# Patient Record
Sex: Male | Born: 1935 | Race: White | Hispanic: No | Marital: Married | Smoking: Current every day smoker
Health system: Southern US, Community
[De-identification: ages and names within clinical notes are randomized; demographics above are authoritative.]

## PROBLEM LIST (undated history)

## (undated) DIAGNOSIS — C679 Malignant neoplasm of bladder, unspecified: Secondary | ICD-10-CM

## (undated) DIAGNOSIS — I639 Cerebral infarction, unspecified: Secondary | ICD-10-CM

## (undated) DIAGNOSIS — Z9884 Bariatric surgery status: Secondary | ICD-10-CM

## (undated) DIAGNOSIS — E785 Hyperlipidemia, unspecified: Secondary | ICD-10-CM

## (undated) DIAGNOSIS — F329 Major depressive disorder, single episode, unspecified: Secondary | ICD-10-CM

## (undated) DIAGNOSIS — C221 Intrahepatic bile duct carcinoma: Secondary | ICD-10-CM

## (undated) DIAGNOSIS — G629 Polyneuropathy, unspecified: Secondary | ICD-10-CM

## (undated) DIAGNOSIS — G4700 Insomnia, unspecified: Secondary | ICD-10-CM

## (undated) DIAGNOSIS — M1990 Unspecified osteoarthritis, unspecified site: Secondary | ICD-10-CM

## (undated) DIAGNOSIS — F32A Depression, unspecified: Secondary | ICD-10-CM

## (undated) DIAGNOSIS — R55 Syncope and collapse: Secondary | ICD-10-CM

## (undated) DIAGNOSIS — K219 Gastro-esophageal reflux disease without esophagitis: Secondary | ICD-10-CM

## (undated) HISTORY — DX: Malignant neoplasm of bladder, unspecified: C67.9

## (undated) HISTORY — DX: Gastro-esophageal reflux disease without esophagitis: K21.9

## (undated) HISTORY — DX: Cerebral infarction, unspecified: I63.9

## (undated) HISTORY — DX: Syncope and collapse: R55

## (undated) HISTORY — DX: Bariatric surgery status: Z98.84

## (undated) HISTORY — DX: Hyperlipidemia, unspecified: E78.5

## (undated) HISTORY — DX: Unspecified osteoarthritis, unspecified site: M19.90

## (undated) HISTORY — DX: Insomnia, unspecified: G47.00

## (undated) HISTORY — PX: BLADDER SURGERY: SHX569

## (undated) HISTORY — DX: Polyneuropathy, unspecified: G62.9

## (undated) HISTORY — PX: ABDOMINAL SURGERY: SHX537

## (undated) HISTORY — DX: Depression, unspecified: F32.A

## (undated) HISTORY — PX: PENILE PROSTHESIS IMPLANT: SHX240

## (undated) HISTORY — DX: Major depressive disorder, single episode, unspecified: F32.9

## (undated) HISTORY — PX: OTHER SURGICAL HISTORY: SHX169

---

## 1996-07-11 DIAGNOSIS — I639 Cerebral infarction, unspecified: Secondary | ICD-10-CM

## 1996-07-11 HISTORY — DX: Cerebral infarction, unspecified: I63.9

## 2002-07-11 HISTORY — PX: OTHER SURGICAL HISTORY: SHX169

## 2003-07-12 HISTORY — PX: OTHER SURGICAL HISTORY: SHX169

## 2010-10-23 ENCOUNTER — Emergency Department (HOSPITAL_COMMUNITY)
Admission: EM | Admit: 2010-10-23 | Discharge: 2010-10-23 | Disposition: A | Discharge status: Home / Self Care | Payer: Medicare Other | Attending: Emergency Medicine | Admitting: Emergency Medicine

## 2010-10-23 ENCOUNTER — Emergency Department (HOSPITAL_COMMUNITY): Payer: Medicare Other

## 2010-10-23 DIAGNOSIS — S01409A Unspecified open wound of unspecified cheek and temporomandibular area, initial encounter: Secondary | ICD-10-CM | POA: Insufficient documentation

## 2010-10-23 DIAGNOSIS — W1809XA Striking against other object with subsequent fall, initial encounter: Secondary | ICD-10-CM | POA: Insufficient documentation

## 2010-10-23 DIAGNOSIS — R55 Syncope and collapse: Secondary | ICD-10-CM | POA: Insufficient documentation

## 2010-10-23 DIAGNOSIS — Z8673 Personal history of transient ischemic attack (TIA), and cerebral infarction without residual deficits: Secondary | ICD-10-CM | POA: Insufficient documentation

## 2010-10-23 DIAGNOSIS — R799 Abnormal finding of blood chemistry, unspecified: Secondary | ICD-10-CM | POA: Insufficient documentation

## 2010-10-23 DIAGNOSIS — E119 Type 2 diabetes mellitus without complications: Secondary | ICD-10-CM | POA: Insufficient documentation

## 2010-10-23 DIAGNOSIS — S0003XA Contusion of scalp, initial encounter: Secondary | ICD-10-CM | POA: Insufficient documentation

## 2010-10-23 DIAGNOSIS — Y93A9 Activity, other involving cardiorespiratory exercise: Secondary | ICD-10-CM | POA: Insufficient documentation

## 2010-10-23 DIAGNOSIS — Z9884 Bariatric surgery status: Secondary | ICD-10-CM | POA: Insufficient documentation

## 2010-10-23 DIAGNOSIS — S0990XA Unspecified injury of head, initial encounter: Secondary | ICD-10-CM | POA: Insufficient documentation

## 2010-10-23 DIAGNOSIS — Y929 Unspecified place or not applicable: Secondary | ICD-10-CM | POA: Insufficient documentation

## 2010-10-23 DIAGNOSIS — IMO0002 Reserved for concepts with insufficient information to code with codable children: Secondary | ICD-10-CM | POA: Insufficient documentation

## 2010-10-23 DIAGNOSIS — S1093XA Contusion of unspecified part of neck, initial encounter: Secondary | ICD-10-CM | POA: Insufficient documentation

## 2010-10-23 DIAGNOSIS — Z79899 Other long term (current) drug therapy: Secondary | ICD-10-CM | POA: Insufficient documentation

## 2010-10-23 LAB — PROTIME-INR
INR: 0.94 (ref 0.00–1.49)
Prothrombin Time: 12.8 s (ref 11.6–15.2)

## 2010-10-23 LAB — COMPREHENSIVE METABOLIC PANEL
ALT: 28 U/L (ref 0–53)
Albumin: 4.1 g/dL (ref 3.5–5.2)
Alkaline Phosphatase: 58 U/L (ref 39–117)
Calcium: 9.5 mg/dL (ref 8.4–10.5)
Potassium: 4 mEq/L (ref 3.5–5.1)
Sodium: 138 mEq/L (ref 135–145)
Total Protein: 7.2 g/dL (ref 6.0–8.3)

## 2010-10-23 LAB — COMPREHENSIVE METABOLIC PANEL WITH GFR
AST: 35 U/L (ref 0–37)
BUN: 14 mg/dL (ref 6–23)
CO2: 27 meq/L (ref 19–32)
Chloride: 106 meq/L (ref 96–112)
Creatinine, Ser: 1.32 mg/dL (ref 0.4–1.5)
GFR calc Af Amer: >60 mL/min (ref >60)
GFR calc non Af Amer: 53 mL/min — ABNORMAL LOW (ref >60)
Glucose, Bld: 99 mg/dL (ref 70–99)
Total Bilirubin: 0.9 mg/dL (ref 0.3–1.2)

## 2010-10-23 LAB — CBC
HCT: 40.4 % (ref 39.0–52.0)
Hemoglobin: 14 g/dL (ref 13.0–17.0)
MCH: 34.9 pg — ABNORMAL HIGH (ref 26.0–34.0)
MCHC: 34.7 g/dL (ref 30.0–36.0)
MCV: 100.7 fL — ABNORMAL HIGH (ref 78.0–100.0)
Platelets: 133 K/uL — ABNORMAL LOW (ref 150–400)
RBC: 4.01 MIL/uL — ABNORMAL LOW (ref 4.22–5.81)
RDW: 13 % (ref 11.5–15.5)
WBC: 5.3 K/uL (ref 4.0–10.5)

## 2010-10-23 LAB — URINALYSIS, ROUTINE W REFLEX MICROSCOPIC
Bilirubin Urine: NEGATIVE
Glucose, UA: NEGATIVE mg/dL
Ketones, ur: NEGATIVE mg/dL
Leukocytes, UA: NEGATIVE
Nitrite: NEGATIVE
Protein, ur: NEGATIVE mg/dL
Specific Gravity, Urine: 1.03 (ref 1.005–1.030)
Urobilinogen, UA: 0.2 mg/dL (ref 0.0–1.0)
pH: 5 (ref 5.0–8.0)

## 2010-10-23 LAB — TROPONIN I: Troponin I: 0.01 ng/mL (ref 0.00–0.06)

## 2010-10-23 LAB — CK TOTAL AND CKMB (NOT AT ARMC)
CK, MB: 3.9 ng/mL (ref 0.3–4.0)
Relative Index: 3 — ABNORMAL HIGH (ref 0.0–2.5)
Total CK: 129 U/L (ref 7–232)

## 2010-10-23 LAB — DIFFERENTIAL
Basophils Absolute: 0 K/uL (ref 0.0–0.1)
Basophils Relative: 0 % (ref 0–1)
Eosinophils Absolute: 0 K/uL (ref 0.0–0.7)
Eosinophils Relative: 1 % (ref 0–5)
Lymphocytes Relative: 17 % (ref 12–46)
Lymphs Abs: 0.9 10*3/uL (ref 0.7–4.0)
Monocytes Absolute: 0.5 10*3/uL (ref 0.1–1.0)
Monocytes Relative: 10 % (ref 3–12)
Neutro Abs: 3.8 10*3/uL (ref 1.7–7.7)
Neutrophils Relative %: 72 % (ref 43–77)

## 2010-10-23 LAB — APTT: aPTT: 24 s (ref 24–37)

## 2010-10-23 LAB — URINE MICROSCOPIC-ADD ON

## 2010-10-23 LAB — D-DIMER, QUANTITATIVE: D-Dimer, Quant: 2.04 ug/mL-FEU — ABNORMAL HIGH (ref 0.00–0.48)

## 2010-10-23 MED ORDER — IOHEXOL 300 MG/ML  SOLN
60.0000 mL | Freq: Once | INTRAMUSCULAR | Status: AC | PRN
  Administered 2010-10-23: 60 mL via INTRAVENOUS

## 2010-12-29 ENCOUNTER — Encounter: Payer: Self-pay | Admitting: *Deleted

## 2010-12-29 ENCOUNTER — Encounter: Payer: Self-pay | Admitting: Internal Medicine

## 2010-12-30 ENCOUNTER — Encounter (INDEPENDENT_AMBULATORY_CARE_PROVIDER_SITE_OTHER): Payer: Self-pay | Admitting: Surgery

## 2010-12-30 ENCOUNTER — Encounter: Payer: Self-pay | Admitting: *Deleted

## 2010-12-30 ENCOUNTER — Encounter: Payer: Self-pay | Admitting: Internal Medicine

## 2010-12-30 ENCOUNTER — Ambulatory Visit (INDEPENDENT_AMBULATORY_CARE_PROVIDER_SITE_OTHER): Payer: Medicare Other | Admitting: Internal Medicine

## 2010-12-30 DIAGNOSIS — G629 Polyneuropathy, unspecified: Secondary | ICD-10-CM | POA: Insufficient documentation

## 2010-12-30 DIAGNOSIS — R55 Syncope and collapse: Secondary | ICD-10-CM

## 2010-12-30 DIAGNOSIS — G589 Mononeuropathy, unspecified: Secondary | ICD-10-CM

## 2010-12-30 NOTE — Patient Instructions (Addendum)
Your physician has recommended that you have a tilt table test. This test is sometimes used to help determine the cause of fainting spells. You lie on a table that moves from a lying down to an upright position. The change in position can bring on loss of consciousness. The doctor monitors your symptoms, heart rate, EKG, and blood pressure throughout the test. The doctor also may give you a medicine and then monitor your response to the medicine. This is done in the hospital and usually takes half of a day to complete the procedure. Please see the instruction sheet given to you today for more information.  Your physician has recommended you make the following change in your medication: Stop Aspirin and Furosemide.  Ask your family physician about decreasing dose of Zoloft.  We will schedule follow up appointment after your tilt table test +/- loop recorder implantation.

## 2010-12-30 NOTE — Assessment & Plan Note (Signed)
As above.

## 2010-12-30 NOTE — Assessment & Plan Note (Signed)
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 He has little warning. Diagnostic testing may be of value here in that recent data from [place] suggests that neurally mediated syndromes are accompanied by significant bradycardia can have improved outcomes with pacing. To that end we'll plan to undertake a tilt table test and implant a loop recorder. In the event that is Diagnostic, we would proceed with pacing possibly with a Biotronik CLS system.  Will also plan to discontinue his Lasix as he has very little issue of fluid now. I suggested further that he see if there is anything he can do for rehabilitation related to his neuropathy and his balance issues. I wonder also whether his SSRI might not be able to be reduced. There are some reports from years ago at SSRI therapy can be aggravating of neurally mediated syndromes.  We have also seen patients who have worsening of neurally mediated syndromes following operation on the gut particularly obesity reduction surgery

## 2010-12-30 NOTE — Progress Notes (Signed)
HPI: Lance Best is a 77 y.o. male Request of Dr. Katrinka Blazing because of recurrent syncope.  This dates back many years. The first episode it was post defecation. He has had more than 20 or 30 episodes since. They come into forms. One is without warning. The other is associated prodrome of heat flushing and some nausea. The recovery phase is surprisingly short and he has little residual orthostatic intolerance. He is somewhat intolerant of heat. He has had a number of episodes in the shower. About 3 years ago he underwent lap band surgery and he thinks that the symptoms are worse since then.    He has undergone an extensive cardiac evaluation in the cardiac history. A Myoview done recently demonstrated normal left ventricular function and no ischemia. A Holter monitor demonstrated normal heart rate excursion and without significant arrhythmia.  He also has a problem with significant balance issues which he attributes to his diabetic neuropathy which has persisted despite the fact that the diabetes has resolved following his lap band surgery. Has some orthostatic intolerance.  He has a long-standing history of treated depressionComment the diagnosis of which is not entirely clear to me. This was made in the context of some Marriage stress Current Outpatient Prescriptions  Medication Sig Dispense Refill  . aspirin 81 MG tablet Take 81 mg by mouth daily.        Marland Kitchen esomeprazole (NEXIUM) 40 MG capsule Take 40 mg by mouth daily before breakfast.        . ezetimibe-simvastatin (VYTORIN) 10-20 MG per tablet Take 1 tablet by mouth at bedtime.        . fluticasone (FLOVENT DISKUS) 50 MCG/BLIST diskus inhaler Inhale 1 puff into the lungs 2 (two) times daily.        . Folic Acid-Vit B6-Vit B12 (FOLBEE) 2.5-25-1 MG TABS Take 1 tablet by mouth daily.        . furosemide (LASIX) 20 MG tablet Take 20 mg by mouth daily.        Marland Kitchen gabapentin (NEURONTIN) 600 MG tablet Take 600 mg by mouth 3 (three) times daily.        .  Meclizine HCl 25 MG CHEW Chew by mouth 3 (three) times daily.        . meloxicam (MOBIC) 7.5 MG tablet Take 7.5 mg by mouth 2 (two) times daily.        . Multiple Vitamins-Minerals (CENTRUM SILVER PO) Take by mouth.        . Probiotic Product (SOLUBLE FIBER/PROBIOTICS PO) Take by mouth.        . sertraline (ZOLOFT) 100 MG tablet Take 100 mg by mouth 2 (two) times daily.       . traMADol (ULTRAM) 50 MG tablet Take 50 mg by mouth every 6 (six) hours as needed.        . zolpidem (AMBIEN) 10 MG tablet Take 10 mg by mouth at bedtime as needed.          No Known Allergies  Past Medical History  Diagnosis Date  . CVA (cerebral infarction)     memory deficit  . Hx of laparoscopic gastric banding     morbid obesity  . Diabetes mellitus   . Insomnia   . Bladder cancer     inflatable penile implant  . Allergic rhinitis   . Hyperlipidemia   . Peripheral neuropathy   . Depression   . Osteoarthritis   . Acid reflux   . Syncope     Past  Surgical History  Procedure Date  . Lap band 2005  . Penile prosthesis implant     due to bladder cancer  . Bladder surgery     for bladder cancer    Family History  Problem Relation Age of Onset  . Hypertension    . Diabetes    . Cancer Sister 75    deceased  . COPD Mother 58    deceased  . Liver disease Sister 24    deceased    History   Social History  . Marital Status: Married    Spouse Name: N/A    Number of Children: 6  . Years of Education: N/A   Occupational History  . retired      no education   Social History Main Topics  . Smoking status: Former Smoker -- 1.0 packs/day  . Smokeless tobacco: Not on file  . Alcohol Use: No  . Drug Use: No  . Sexually Active: Not on file   Other Topics Concern  . Not on file   Social History Narrative  . No narrative on file    Fourteen point review of systems was negative except as noted in HPI and PMH   PHYSICAL EXAMINATION  Blood pressure 131/84, pulse 101, height 5\' 11"   (1.803 m), weight 211 lb 1.9 oz (95.763 kg).   Well developed and nourished Older Caucasian male appearing somewhat older than his stated agein no acute distress HENT normal Neck supple with JVP 7-8 cmCarotids brisk and full without bruits Back without scoliosis or kyphosis Clear Regular rate and rhythm, no murmurs or gallops Abd-soft with active BS without hepatomegaly or midline pulsation Femoral pulses 2+ distal pulses intact No Clubbing cyanosis edema Skin-warm and dry LN-neg submandibular and supraclavicular A & Oriented CN 3-12 normal  Grossly normal sensory and motor functionAlthough he walks with a wide-based gait Affect engaging . Sinus rhythm at 92 Intervals 0.15/0.09/0.37 Axis is 38 Otherwise normal

## 2011-01-04 ENCOUNTER — Telehealth: Payer: Self-pay | Admitting: Internal Medicine

## 2011-01-04 NOTE — Telephone Encounter (Signed)
Given the patient's frequency of syncope, and external event recorder may be of value.

## 2011-01-04 NOTE — Telephone Encounter (Signed)
I called and made the patient aware that per Dr. [name], he needs to get pain meds through his PCP. I also stated that Dr. [name] would favor an event monitor if he continues to have syncopal episodes. I have encouraged him to contact me before he leaves town on Friday if he has another episode, so he can have his monitor while he is out of town. He verbalizes understanding.

## 2011-01-04 NOTE — Telephone Encounter (Signed)
Pt keeps having syncope episodes and he hurt his back when he had an episode at the pool and he hurt his back and he wants some pain pills cause he hurt it really bad and he can't come in because he has a family trip

## 2011-01-04 NOTE — Telephone Encounter (Signed)
I called and spoke with the patient. He states he has had 2 episodes of syncope since he was here for an office visit. One episode he was walking in the park. The one on Sunday occurred at the pool. He had been laying out tanning for about 45 minutes. When he go up he fell flat on his back. He called his PCP for pain meds yesterday, but has not heard anything back yet. I explained that we typically do not prescribe pain medications, but I would review this with Dr. [name] and call him back later today. He has not had his back checked out because he states he is trying to avoid this because he does not have time and is schedule to leave for [facility] on Friday.

## 2011-01-05 ENCOUNTER — Ambulatory Visit
Admission: RE | Admit: 2011-01-05 | Discharge: 2011-01-05 | Disposition: A | Discharge status: Home / Self Care | Payer: Medicare Other | Source: Ambulatory Visit | Attending: Family Medicine | Admitting: Family Medicine

## 2011-01-05 ENCOUNTER — Other Ambulatory Visit: Payer: Self-pay | Admitting: Family Medicine

## 2011-01-05 ENCOUNTER — Other Ambulatory Visit: Payer: Self-pay | Admitting: *Deleted

## 2011-01-05 DIAGNOSIS — R52 Pain, unspecified: Secondary | ICD-10-CM

## 2011-01-05 DIAGNOSIS — W19XXXA Unspecified fall, initial encounter: Secondary | ICD-10-CM

## 2011-01-07 ENCOUNTER — Encounter (INDEPENDENT_AMBULATORY_CARE_PROVIDER_SITE_OTHER): Payer: Self-pay | Admitting: Physician Assistant

## 2011-01-07 ENCOUNTER — Ambulatory Visit (INDEPENDENT_AMBULATORY_CARE_PROVIDER_SITE_OTHER): Payer: Medicare Other | Admitting: Physician Assistant

## 2011-01-07 ENCOUNTER — Encounter (INDEPENDENT_AMBULATORY_CARE_PROVIDER_SITE_OTHER): Payer: Medicare Other

## 2011-01-07 VITALS — BP 118/86 | Ht 70.0 in | Wt 212.6 lb

## 2011-01-07 DIAGNOSIS — Z973 Presence of spectacles and contact lenses: Secondary | ICD-10-CM

## 2011-01-07 DIAGNOSIS — I10 Essential (primary) hypertension: Secondary | ICD-10-CM

## 2011-01-07 DIAGNOSIS — M1990 Unspecified osteoarthritis, unspecified site: Secondary | ICD-10-CM

## 2011-01-07 DIAGNOSIS — Z972 Presence of dental prosthetic device (complete) (partial): Secondary | ICD-10-CM

## 2011-01-07 DIAGNOSIS — Z4651 Encounter for fitting and adjustment of gastric lap band: Secondary | ICD-10-CM

## 2011-01-07 NOTE — Patient Instructions (Signed)
Take clear liquids for the next 24 hours then advance diet slowly as tolerated. Follow-up in 4-6 weeks or sooner if symptoms persist or worsen.

## 2011-01-07 NOTE — Progress Notes (Signed)
Subjective:     Patient ID: Lance Best, male   DOB: 05-03-1934, 77 y.o.   MRN: [id number]    BP 118/86  Ht 5\' 10"  (1.778 m)  Wt 212 lb 9.6 oz (96.435 kg)  BMI 30.51 kg/m2    HPI Please see scanned dictated note  Review of Systems     Objective:   Physical Exam     Assessment:         Plan:

## 2011-01-17 ENCOUNTER — Ambulatory Visit (HOSPITAL_COMMUNITY)
Admission: RE | Admit: 2011-01-17 | Discharge: 2011-01-17 | Disposition: A | Discharge status: Home / Self Care | Payer: Medicare Other | Source: Ambulatory Visit | Attending: Internal Medicine | Admitting: Internal Medicine

## 2011-01-17 DIAGNOSIS — M545 Low back pain, unspecified: Secondary | ICD-10-CM

## 2011-01-17 DIAGNOSIS — Z79899 Other long term (current) drug therapy: Secondary | ICD-10-CM | POA: Insufficient documentation

## 2011-01-17 DIAGNOSIS — I951 Orthostatic hypotension: Secondary | ICD-10-CM | POA: Insufficient documentation

## 2011-01-17 DIAGNOSIS — R55 Syncope and collapse: Secondary | ICD-10-CM

## 2011-01-17 DIAGNOSIS — Z01812 Encounter for preprocedural laboratory examination: Secondary | ICD-10-CM | POA: Insufficient documentation

## 2011-01-17 LAB — TSH: TSH: 3.713 u[IU]/mL (ref 0.350–4.500)

## 2011-01-17 LAB — BASIC METABOLIC PANEL
BUN: 23 mg/dL (ref 6–23)
Chloride: 105 mEq/L (ref 96–112)
GFR calc Af Amer: >60 mL/min (ref >60)
Glucose, Bld: 87 mg/dL (ref 70–99)
Potassium: 4.2 mEq/L (ref 3.5–5.1)

## 2011-01-17 LAB — CBC
HCT: 40.3 % (ref 39.0–52.0)
Hemoglobin: 13.7 g/dL (ref 13.0–17.0)
MCH: 34.5 pg — ABNORMAL HIGH (ref 26.0–34.0)
MCHC: 34 g/dL (ref 30.0–36.0)
MCV: 101.5 fL — ABNORMAL HIGH (ref 78.0–100.0)

## 2011-01-17 LAB — PROTIME-INR: INR: 0.92 (ref 0.00–1.49)

## 2011-01-18 ENCOUNTER — Ambulatory Visit: Payer: Medicare Other

## 2011-01-27 NOTE — Op Note (Signed)
  NAME:  Lance Best, RAZ NO.:  [id number]  MEDICAL RECORD NO.:  [id number]  LOCATION:  [facility]                         FACILITY:  [facility]  PHYSICIAN:  Duke Salvia, MD, FACCDATE OF BIRTH:  21-Jun-1934  DATE OF PROCEDURE: DATE OF DISCHARGE:                              OPERATIVE REPORT   PREOPERATIVE DIAGNOSIS:  Syncope.  POSTOPERATIVE DIAGNOSIS:  Syncope.  PROCEDURE:  Tilt-table testing #1, tilt-table testing #2 implantable loop recorder insertion.  Following obtaining informed consent, the patient was equilibrated in supine position.  He was then tilted up at 70 degrees.  There was initial fall in blood pressure from 140-107.  This then re-stabilized in the 130 range.  It gradually drifted down to the 110 range or so.  At that point, nitroglycerin was administered 0.4 mg.  The blood pressure fell gradually into the 70s.  It stayed there for the last 5 minutes with minimal symptoms.  Heart rate did not change significantly.  IMPRESSION:  Orthostatic hypotension without significant symptoms.  We then proceeded to loop recorder insertion.  After routine prep and drape of the left chest, lidocaine was infiltrated lateral to the sternum and caudal to the clavicle in about 1.5 cm in length.  It was carried down at the level of the prepectoral fascia and a pocket was fashioned on the prepectoral fascia both cephalad and caudal to the incision.  Two 2-0 silk sutures were placed at the cephalad aspect of the pocket and then a St. Jude implantable loop monitor was inserted, model DM 2100, serial number [id number].  The pocket was copiously irrigated with antibiotic containing saline solution.  Hemostasis was assured.  The device was secured to the prepectoral fascia and the wound was closed in three layers in normal fashion.  The wound was washed, dried and Benzoin and Steri-Strip dressing was applied.     Duke Salvia, MD, [facility]     SCK/MEDQ  D:  01/17/2011   T:  01/18/2011  Job:  [id number]  Electronically Signed by Sherryl Manges MD [facility] on 01/27/2011 02:13:42 PM

## 2011-02-02 ENCOUNTER — Ambulatory Visit (INDEPENDENT_AMBULATORY_CARE_PROVIDER_SITE_OTHER): Payer: Medicare Other | Admitting: *Deleted

## 2011-02-02 DIAGNOSIS — R55 Syncope and collapse: Secondary | ICD-10-CM

## 2011-02-02 LAB — PACEMAKER DEVICE OBSERVATION

## 2011-02-02 NOTE — Progress Notes (Signed)
Loop recorder check in clinic  

## 2011-02-04 ENCOUNTER — Encounter (INDEPENDENT_AMBULATORY_CARE_PROVIDER_SITE_OTHER): Payer: Medicare Other | Admitting: Surgery

## 2011-02-10 ENCOUNTER — Ambulatory Visit: Payer: Medicare Other | Attending: Orthopedic Surgery | Admitting: Rehabilitation

## 2011-02-10 DIAGNOSIS — M25659 Stiffness of unspecified hip, not elsewhere classified: Secondary | ICD-10-CM | POA: Insufficient documentation

## 2011-02-10 DIAGNOSIS — IMO0001 Reserved for inherently not codable concepts without codable children: Secondary | ICD-10-CM | POA: Insufficient documentation

## 2011-02-10 DIAGNOSIS — R293 Abnormal posture: Secondary | ICD-10-CM | POA: Insufficient documentation

## 2011-02-10 DIAGNOSIS — M545 Low back pain, unspecified: Secondary | ICD-10-CM | POA: Insufficient documentation

## 2011-02-11 ENCOUNTER — Encounter (INDEPENDENT_AMBULATORY_CARE_PROVIDER_SITE_OTHER): Payer: Medicare Other

## 2011-02-17 ENCOUNTER — Telehealth: Payer: Self-pay | Admitting: Internal Medicine

## 2011-02-17 ENCOUNTER — Encounter: Payer: Medicare Other | Admitting: Physical Therapy

## 2011-02-17 ENCOUNTER — Ambulatory Visit: Payer: Medicare Other | Admitting: Rehabilitative and Restorative Service Providers"

## 2011-02-23 ENCOUNTER — Ambulatory Visit: Payer: Medicare Other | Admitting: Rehabilitation

## 2011-03-01 ENCOUNTER — Ambulatory Visit: Payer: Medicare Other | Admitting: Physical Therapy

## 2011-03-02 ENCOUNTER — Encounter (INDEPENDENT_AMBULATORY_CARE_PROVIDER_SITE_OTHER): Payer: Medicare Other | Admitting: Surgery

## 2011-03-02 ENCOUNTER — Ambulatory Visit (INDEPENDENT_AMBULATORY_CARE_PROVIDER_SITE_OTHER): Payer: Medicare Other | Admitting: Surgery

## 2011-03-03 ENCOUNTER — Encounter (INDEPENDENT_AMBULATORY_CARE_PROVIDER_SITE_OTHER): Payer: Self-pay | Admitting: General Surgery

## 2011-03-04 ENCOUNTER — Encounter (INDEPENDENT_AMBULATORY_CARE_PROVIDER_SITE_OTHER): Payer: Medicare Other | Admitting: Surgery

## 2011-03-04 ENCOUNTER — Encounter (INDEPENDENT_AMBULATORY_CARE_PROVIDER_SITE_OTHER): Payer: Self-pay | Admitting: Surgery

## 2011-03-04 ENCOUNTER — Ambulatory Visit (INDEPENDENT_AMBULATORY_CARE_PROVIDER_SITE_OTHER): Payer: Medicare Other | Admitting: Surgery

## 2011-03-04 DIAGNOSIS — Z9884 Bariatric surgery status: Secondary | ICD-10-CM | POA: Insufficient documentation

## 2011-03-04 DIAGNOSIS — Z4651 Encounter for fitting and adjustment of gastric lap band: Secondary | ICD-10-CM

## 2011-03-04 NOTE — Progress Notes (Signed)
Lance Best comes in today having gained 15 lbs since June 29th.  At that time Lance Best removed 0.5 cc from his band.  Today I added 0.4 cc back to his band.  Will see him back in 6 weeks

## 2011-03-04 NOTE — Patient Instructions (Signed)

## 2011-03-09 ENCOUNTER — Ambulatory Visit: Payer: Medicare Other | Admitting: Physical Therapy

## 2011-03-10 ENCOUNTER — Ambulatory Visit: Payer: Medicare Other | Admitting: Physical Therapy

## 2011-03-21 ENCOUNTER — Ambulatory Visit: Payer: Medicare Other | Attending: Orthopedic Surgery | Admitting: Physical Therapy

## 2011-03-21 DIAGNOSIS — M545 Low back pain, unspecified: Secondary | ICD-10-CM | POA: Insufficient documentation

## 2011-03-21 DIAGNOSIS — R293 Abnormal posture: Secondary | ICD-10-CM | POA: Insufficient documentation

## 2011-03-21 DIAGNOSIS — IMO0001 Reserved for inherently not codable concepts without codable children: Secondary | ICD-10-CM | POA: Insufficient documentation

## 2011-03-21 DIAGNOSIS — M25659 Stiffness of unspecified hip, not elsewhere classified: Secondary | ICD-10-CM | POA: Insufficient documentation

## 2011-03-24 ENCOUNTER — Ambulatory Visit: Payer: Medicare Other

## 2011-03-28 ENCOUNTER — Ambulatory Visit: Payer: Medicare Other | Admitting: Rehabilitative and Restorative Service Providers"

## 2011-03-31 ENCOUNTER — Encounter: Payer: Medicare Other | Admitting: Physical Therapy

## 2011-04-22 ENCOUNTER — Encounter (INDEPENDENT_AMBULATORY_CARE_PROVIDER_SITE_OTHER): Payer: Medicare Other | Admitting: Surgery

## 2011-05-05 ENCOUNTER — Encounter: Payer: Self-pay | Admitting: Internal Medicine

## 2011-05-05 ENCOUNTER — Ambulatory Visit (INDEPENDENT_AMBULATORY_CARE_PROVIDER_SITE_OTHER): Payer: Medicare Other | Admitting: Internal Medicine

## 2011-05-05 DIAGNOSIS — Z959 Presence of cardiac and vascular implant and graft, unspecified: Secondary | ICD-10-CM | POA: Insufficient documentation

## 2011-05-05 DIAGNOSIS — R55 Syncope and collapse: Secondary | ICD-10-CM

## 2011-05-05 NOTE — Patient Instructions (Signed)
Your physician recommends that you schedule a follow-up appointment in: 3 months with Kristin/Paula for a device check.  Your physician wants you to follow-up in: 6 months with Dr. [name]. You will receive a reminder letter in the mail two months in advance. If you don't receive a letter, please call our office to schedule the follow-up appointment.  Your physician recommends that you continue on your current medications as directed. Please refer to the Current Medication list given to you today.

## 2011-05-05 NOTE — Assessment & Plan Note (Signed)
As described above °

## 2011-05-05 NOTE — Progress Notes (Signed)
  HPI  Lance Best is a 77 y.o. male See in follow up for syncope. He is status post loop recorder implantation.  He has had one episode of recurrent syncope. This was typical. He got out of the hot bathtub and fell. He activated the monitor. Shows sinus rhythm.  Reviewing his syncopal history, many of these episodes are associated with being warm. He thinks he exposure as opposed to part of the autocrine epr phenomena  Past Medical History  Diagnosis Date  . CVA (cerebral infarction)     memory deficit  . Hx of laparoscopic gastric banding     morbid obesity  . Diabetes mellitus   . Insomnia   . Bladder cancer     inflatable penile implant  . Allergic rhinitis   . Hyperlipidemia   . Peripheral neuropathy   . Depression   . Osteoarthritis   . Acid reflux   . Syncope     Past Surgical History  Procedure Date  . Lap band 2005  . Penile prosthesis implant     due to bladder cancer  . Bladder surgery     for bladder cancer    Current Outpatient Prescriptions  Medication Sig Dispense Refill  . esomeprazole (NEXIUM) 40 MG capsule Take 40 mg by mouth daily before breakfast.        . fluticasone (FLOVENT DISKUS) 50 MCG/BLIST diskus inhaler Inhale 1 puff into the lungs 2 (two) times daily.        . Folic Acid-Vit B6-Vit B12 (FOLBEE) 2.5-25-1 MG TABS Take 1 tablet by mouth daily.        Marland Kitchen gabapentin (NEURONTIN) 600 MG tablet Take 600 mg by mouth 3 (three) times daily.        . Meclizine HCl 25 MG CHEW Chew by mouth 3 (three) times daily.        . meloxicam (MOBIC) 7.5 MG tablet Take 7.5 mg by mouth 2 (two) times daily.        . Multiple Vitamins-Minerals (CENTRUM SILVER PO) Take by mouth.        . oxyCODONE-acetaminophen (PERCOCET) 10-325 MG per tablet Take 1 tablet by mouth every 4 (four) hours as needed.        . Probiotic Product (SOLUBLE FIBER/PROBIOTICS PO) Take by mouth.        . sertraline (ZOLOFT) 100 MG tablet Take 100 mg by mouth 2 (two) times daily.       . traMADol  (ULTRAM) 50 MG tablet Take 50 mg by mouth every 6 (six) hours as needed.        . zolpidem (AMBIEN) 10 MG tablet Take 10 mg by mouth at bedtime as needed.          No Known Allergies  Review of Systems negative except from HPI and PMH  Physical Exam Well developed and well nourished in no acute distress HENT normal E scleral and icterus clear JVP flat; carotids brisk and full Clear to ausculation Soft with active bowel sounds No clubbing cyanosis and edema Alert and oriented, grossly normal motor and sensory function Skin Warm and Dry  Assessment and  Plan

## 2011-05-05 NOTE — Assessment & Plan Note (Addendum)
His syncopal episode was associated with sinus rhythm. This eliminates the option almost certainly of pacing. We have discussed the importance of recognizing the trigger and becoming supine or seated and then being careful to wait until it is resolved. One occasion he sat down with morning and is set up subsequently he passed out.  Options would include ProAmatine and/or support stockings. The latter are little bit less attractive given the infrequency of these episodes this also is true for the ProAmatine. Has tried to identify triggers makes most sense

## 2011-07-01 ENCOUNTER — Encounter: Payer: Self-pay | Admitting: Internal Medicine

## 2011-07-12 DIAGNOSIS — C221 Intrahepatic bile duct carcinoma: Secondary | ICD-10-CM

## 2011-07-12 HISTORY — PX: OTHER SURGICAL HISTORY: SHX169

## 2011-07-12 HISTORY — DX: Intrahepatic bile duct carcinoma: C22.1

## 2011-08-08 ENCOUNTER — Ambulatory Visit (INDEPENDENT_AMBULATORY_CARE_PROVIDER_SITE_OTHER): Payer: Medicare Other | Admitting: *Deleted

## 2011-08-08 ENCOUNTER — Encounter: Payer: Self-pay | Admitting: Internal Medicine

## 2011-08-08 DIAGNOSIS — R55 Syncope and collapse: Secondary | ICD-10-CM

## 2011-08-08 NOTE — Progress Notes (Signed)
ILR check 

## 2011-08-22 ENCOUNTER — Telehealth: Payer: Self-pay | Admitting: Internal Medicine

## 2011-08-22 NOTE — Telephone Encounter (Signed)
I attempted to call the patient. I left a message I would call him back in the morning.

## 2011-08-22 NOTE — Telephone Encounter (Signed)
FU Call: pt calling back to speak with Heather. Please return pt call to discuss further. Pt not c/o any negative symptoms at this time.

## 2011-08-22 NOTE — Telephone Encounter (Signed)
New Msg: pt calling wanting to speak with nurse regarding pt having a couple episode of syncope yesterday while at the [facility]. Pt said ambulance was called out and it was determined that pt oxygen level was low, his BP was good, and blood sugar. Pt refused to go to hospital. Pt wanted to inform nurse/MD about this and wanted to know if pt needed to come into office to be evaluated. Please return pt call to discuss further.

## 2011-08-23 ENCOUNTER — Ambulatory Visit (INDEPENDENT_AMBULATORY_CARE_PROVIDER_SITE_OTHER): Payer: Medicare Other | Admitting: *Deleted

## 2011-08-23 ENCOUNTER — Encounter: Payer: Self-pay | Admitting: Internal Medicine

## 2011-08-23 DIAGNOSIS — R55 Syncope and collapse: Secondary | ICD-10-CM

## 2011-08-23 LAB — PACEMAKER DEVICE OBSERVATION

## 2011-08-23 NOTE — Telephone Encounter (Signed)
I spoke with the patient. He states he was at the [facility] yesterday. He has recently joined. He was talking with the staff about starting an exercise program there and was told they would evaluate him on several different things to see what he could tolerate. He states he was exercising on his own and felt like he was going to pass out. He states he did not pass out completely, but his daughter was with him and she thought he was having seizure activity. He was staring and then had about 4 episodes of jerking. I explained to the patient I would review with Dr. [name] and call him back. He may just want him to have his ILR interrogated.

## 2011-08-23 NOTE — Progress Notes (Signed)
Loop interrogation for syncopal episode.

## 2011-08-23 NOTE — Telephone Encounter (Signed)
Per Dr. [name], have ILR interrogated. The patient will go ahead and come now. Device clinic is aware.

## 2011-11-07 ENCOUNTER — Encounter: Payer: Medicare Other | Admitting: *Deleted

## 2011-11-23 ENCOUNTER — Encounter: Payer: Self-pay | Admitting: Internal Medicine

## 2011-11-23 ENCOUNTER — Ambulatory Visit (INDEPENDENT_AMBULATORY_CARE_PROVIDER_SITE_OTHER): Payer: Medicare Other | Admitting: *Deleted

## 2011-11-23 DIAGNOSIS — R55 Syncope and collapse: Secondary | ICD-10-CM

## 2011-11-23 NOTE — Progress Notes (Signed)
Loop recorder check in clinic  

## 2011-12-01 ENCOUNTER — Ambulatory Visit: Payer: Medicare Other | Attending: Family Medicine | Admitting: Physical Therapy

## 2011-12-01 DIAGNOSIS — IMO0001 Reserved for inherently not codable concepts without codable children: Secondary | ICD-10-CM | POA: Insufficient documentation

## 2011-12-01 DIAGNOSIS — R42 Dizziness and giddiness: Secondary | ICD-10-CM | POA: Insufficient documentation

## 2011-12-01 DIAGNOSIS — R269 Unspecified abnormalities of gait and mobility: Secondary | ICD-10-CM | POA: Insufficient documentation

## 2011-12-12 ENCOUNTER — Ambulatory Visit: Payer: Medicare Other | Attending: Family Medicine | Admitting: Physical Therapy

## 2011-12-12 DIAGNOSIS — R42 Dizziness and giddiness: Secondary | ICD-10-CM | POA: Insufficient documentation

## 2011-12-12 DIAGNOSIS — IMO0001 Reserved for inherently not codable concepts without codable children: Secondary | ICD-10-CM | POA: Insufficient documentation

## 2011-12-12 DIAGNOSIS — R269 Unspecified abnormalities of gait and mobility: Secondary | ICD-10-CM | POA: Insufficient documentation

## 2011-12-15 ENCOUNTER — Ambulatory Visit: Payer: Medicare Other | Admitting: Physical Therapy

## 2011-12-19 ENCOUNTER — Ambulatory Visit: Payer: Medicare Other | Admitting: Physical Therapy

## 2011-12-22 ENCOUNTER — Encounter: Payer: Medicare Other | Admitting: Physical Therapy

## 2011-12-22 ENCOUNTER — Ambulatory Visit: Payer: Medicare Other | Admitting: Physical Therapy

## 2011-12-26 ENCOUNTER — Ambulatory Visit: Payer: Medicare Other | Admitting: Physical Therapy

## 2011-12-29 ENCOUNTER — Ambulatory Visit: Payer: Medicare Other | Admitting: Physical Therapy

## 2012-01-31 ENCOUNTER — Ambulatory Visit (INDEPENDENT_AMBULATORY_CARE_PROVIDER_SITE_OTHER): Payer: Medicare Other | Admitting: Surgery

## 2012-01-31 ENCOUNTER — Encounter (INDEPENDENT_AMBULATORY_CARE_PROVIDER_SITE_OTHER): Payer: Self-pay | Admitting: Surgery

## 2012-01-31 VITALS — BP 130/81 | HR 86 | Ht 70.0 in | Wt 218.0 lb

## 2012-01-31 DIAGNOSIS — Z9884 Bariatric surgery status: Secondary | ICD-10-CM

## 2012-01-31 NOTE — Patient Instructions (Addendum)

## 2012-01-31 NOTE — Progress Notes (Signed)
URGENT Office Lance Best 78 y.o.  Body mass index is 31.28 kg/(m^2).  Patient Active Problem List  Diagnosis  . Syncope-presumed neurally mediated  . Neuropathy-diabetic  . Wears glasses  . Arthritis  . Wears dentures  . High blood pressure  . History of laparoscopic adjustable gastric banding  . 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 Jude    No Known Allergies  Past Surgical History  Procedure Date  . Lap band 2005  . Penile prosthesis implant     due to bladder cancer  . Bladder surgery     for bladder cancer   Lance Copas, MD No diagnosis found.  Has been too tight of late.  He says he doesn't want too much taken out (band vacation)  I removed 0.3 cc from his band and her was able to drink OK.  Will see back as needed.  He is contemplating moving back to [place].   Lance B. Daphine Deutscher, MD, [facility] [phone] beeper [phone]  01/31/2012 3:42 PM

## 2012-02-16 ENCOUNTER — Emergency Department (HOSPITAL_COMMUNITY): Payer: Medicare Other

## 2012-02-16 ENCOUNTER — Encounter (HOSPITAL_COMMUNITY): Payer: Self-pay | Admitting: Emergency Medicine

## 2012-02-16 ENCOUNTER — Inpatient Hospital Stay (HOSPITAL_COMMUNITY)
Admission: EM | Admit: 2012-02-16 | Discharge: 2012-02-23 | DRG: 436 | Disposition: A | Discharge status: Home Health | Payer: Medicare Other | Attending: Internal Medicine | Admitting: Internal Medicine

## 2012-02-16 ENCOUNTER — Encounter (INDEPENDENT_AMBULATORY_CARE_PROVIDER_SITE_OTHER): Payer: Self-pay | Admitting: Physician Assistant

## 2012-02-16 ENCOUNTER — Ambulatory Visit
Admission: RE | Admit: 2012-02-16 | Discharge: 2012-02-16 | Disposition: A | Discharge status: Home / Self Care | Payer: Medicare Other | Source: Ambulatory Visit | Attending: Physician Assistant | Admitting: Physician Assistant

## 2012-02-16 ENCOUNTER — Telehealth (INDEPENDENT_AMBULATORY_CARE_PROVIDER_SITE_OTHER): Payer: Self-pay | Admitting: General Surgery

## 2012-02-16 ENCOUNTER — Ambulatory Visit (INDEPENDENT_AMBULATORY_CARE_PROVIDER_SITE_OTHER): Payer: Medicare Other | Admitting: Physician Assistant

## 2012-02-16 DIAGNOSIS — R140 Abdominal distension (gaseous): Secondary | ICD-10-CM

## 2012-02-16 DIAGNOSIS — I10 Essential (primary) hypertension: Secondary | ICD-10-CM

## 2012-02-16 DIAGNOSIS — Z8551 Personal history of malignant neoplasm of bladder: Secondary | ICD-10-CM

## 2012-02-16 DIAGNOSIS — E785 Hyperlipidemia, unspecified: Secondary | ICD-10-CM | POA: Diagnosis present

## 2012-02-16 DIAGNOSIS — R932 Abnormal findings on diagnostic imaging of liver and biliary tract: Secondary | ICD-10-CM | POA: Diagnosis present

## 2012-02-16 DIAGNOSIS — C221 Intrahepatic bile duct carcinoma: Principal | ICD-10-CM | POA: Diagnosis present

## 2012-02-16 DIAGNOSIS — E1149 Type 2 diabetes mellitus with other diabetic neurological complication: Secondary | ICD-10-CM | POA: Diagnosis present

## 2012-02-16 DIAGNOSIS — R180 Malignant ascites: Secondary | ICD-10-CM

## 2012-02-16 DIAGNOSIS — I8510 Secondary esophageal varices without bleeding: Secondary | ICD-10-CM | POA: Diagnosis present

## 2012-02-16 DIAGNOSIS — K319 Disease of stomach and duodenum, unspecified: Secondary | ICD-10-CM | POA: Diagnosis present

## 2012-02-16 DIAGNOSIS — Z9884 Bariatric surgery status: Secondary | ICD-10-CM

## 2012-02-16 DIAGNOSIS — K7460 Unspecified cirrhosis of liver: Secondary | ICD-10-CM | POA: Diagnosis present

## 2012-02-16 DIAGNOSIS — N400 Enlarged prostate without lower urinary tract symptoms: Secondary | ICD-10-CM | POA: Diagnosis present

## 2012-02-16 DIAGNOSIS — R5381 Other malaise: Secondary | ICD-10-CM | POA: Diagnosis not present

## 2012-02-16 DIAGNOSIS — K219 Gastro-esophageal reflux disease without esophagitis: Secondary | ICD-10-CM | POA: Diagnosis present

## 2012-02-16 DIAGNOSIS — Z8673 Personal history of transient ischemic attack (TIA), and cerebral infarction without residual deficits: Secondary | ICD-10-CM

## 2012-02-16 DIAGNOSIS — R160 Hepatomegaly, not elsewhere classified: Secondary | ICD-10-CM

## 2012-02-16 DIAGNOSIS — R141 Gas pain: Secondary | ICD-10-CM

## 2012-02-16 DIAGNOSIS — D696 Thrombocytopenia, unspecified: Secondary | ICD-10-CM | POA: Diagnosis present

## 2012-02-16 DIAGNOSIS — I8500 Esophageal varices without bleeding: Secondary | ICD-10-CM | POA: Diagnosis present

## 2012-02-16 DIAGNOSIS — E1142 Type 2 diabetes mellitus with diabetic polyneuropathy: Secondary | ICD-10-CM | POA: Diagnosis present

## 2012-02-16 DIAGNOSIS — N390 Urinary tract infection, site not specified: Secondary | ICD-10-CM | POA: Diagnosis present

## 2012-02-16 DIAGNOSIS — K766 Portal hypertension: Secondary | ICD-10-CM | POA: Diagnosis present

## 2012-02-16 DIAGNOSIS — E871 Hypo-osmolality and hyponatremia: Secondary | ICD-10-CM | POA: Diagnosis present

## 2012-02-16 DIAGNOSIS — R7402 Elevation of levels of lactic acid dehydrogenase (LDH): Secondary | ICD-10-CM | POA: Diagnosis present

## 2012-02-16 DIAGNOSIS — R188 Other ascites: Secondary | ICD-10-CM | POA: Diagnosis present

## 2012-02-16 DIAGNOSIS — M1990 Unspecified osteoarthritis, unspecified site: Secondary | ICD-10-CM

## 2012-02-16 DIAGNOSIS — Z79899 Other long term (current) drug therapy: Secondary | ICD-10-CM

## 2012-02-16 DIAGNOSIS — G629 Polyneuropathy, unspecified: Secondary | ICD-10-CM

## 2012-02-16 DIAGNOSIS — R1110 Vomiting, unspecified: Secondary | ICD-10-CM | POA: Diagnosis present

## 2012-02-16 DIAGNOSIS — K8020 Calculus of gallbladder without cholecystitis without obstruction: Secondary | ICD-10-CM | POA: Diagnosis present

## 2012-02-16 DIAGNOSIS — Z959 Presence of cardiac and vascular implant and graft, unspecified: Secondary | ICD-10-CM

## 2012-02-16 DIAGNOSIS — D490 Neoplasm of unspecified behavior of digestive system: Secondary | ICD-10-CM | POA: Diagnosis present

## 2012-02-16 HISTORY — DX: Cerebral infarction, unspecified: I63.9

## 2012-02-16 LAB — CBC WITH DIFFERENTIAL/PLATELET
Basophils Relative: 0 % (ref 0–1)
Eosinophils Absolute: 0 10*3/uL (ref 0.0–0.7)
Eosinophils Relative: 1 % (ref 0–5)
Hemoglobin: 15 g/dL (ref 13.0–17.0)
MCH: 36 pg — ABNORMAL HIGH (ref 26.0–34.0)
MCHC: 34.8 g/dL (ref 30.0–36.0)
Monocytes Absolute: 1 10*3/uL (ref 0.1–1.0)
Monocytes Relative: 12 % (ref 3–12)
Neutrophils Relative %: 67 % (ref 43–77)

## 2012-02-16 LAB — COMPREHENSIVE METABOLIC PANEL
Albumin: 2.9 g/dL — ABNORMAL LOW (ref 3.5–5.2)
BUN: 20 mg/dL (ref 6–23)
Calcium: 9.1 mg/dL (ref 8.4–10.5)
Creatinine, Ser: 0.98 mg/dL (ref 0.50–1.35)
Potassium: 4 mEq/L (ref 3.5–5.1)
Total Protein: 7.1 g/dL (ref 6.0–8.3)

## 2012-02-16 LAB — URINE MICROSCOPIC-ADD ON

## 2012-02-16 LAB — URINALYSIS, ROUTINE W REFLEX MICROSCOPIC
Glucose, UA: NEGATIVE mg/dL
Nitrite: POSITIVE — AB
Specific Gravity, Urine: 1.031 — ABNORMAL HIGH (ref 1.005–1.030)
pH: 6 (ref 5.0–8.0)

## 2012-02-16 LAB — LIPASE, BLOOD: Lipase: 89 U/L — ABNORMAL HIGH (ref 11–59)

## 2012-02-16 MED ORDER — SODIUM CHLORIDE 0.9 % IV BOLUS (SEPSIS)
500.0000 mL | Freq: Once | INTRAVENOUS | Status: AC
  Administered 2012-02-16: 500 mL via INTRAVENOUS

## 2012-02-16 MED ORDER — IOHEXOL 300 MG/ML  SOLN
100.0000 mL | Freq: Once | INTRAMUSCULAR | Status: AC | PRN
  Administered 2012-02-16: 100 mL via INTRAVENOUS

## 2012-02-16 MED ORDER — DEXTROSE 5 % IV SOLN
1.0000 g | Freq: Once | INTRAVENOUS | Status: AC
  Administered 2012-02-16: 1 g via INTRAVENOUS
  Filled 2012-02-16: qty 10

## 2012-02-16 NOTE — ED Provider Notes (Signed)
History     CSN: [id number]  Arrival date & time 02/16/12  1706   First MD Initiated Contact with Patient 02/16/12 1811      Chief Complaint  Patient presents with  . Abdominal Pain  . abd swelling     (Consider location/radiation/quality/duration/timing/severity/associated sxs/prior treatment) HPI Pt seen in [facility] for abd distention and diffuse pain with decreased stool production. States had 2 small stools this morning but has not passed gas since. No fever, chills. +nausea. Pt has lap band in place. Obstruction series ordered that showed possible SBO.  Past Medical History  Diagnosis Date  . CVA (cerebral infarction)     memory deficit  . Hx of laparoscopic gastric banding     morbid obesity  . Diabetes mellitus   . Insomnia   . Allergic rhinitis   . Hyperlipidemia   . Peripheral neuropathy   . Depression   . Osteoarthritis   . Acid reflux   . Syncope   . Stroke 1998    multiple   . Bladder cancer     inflatable penile implant  . Cholangiocarcinoma 2013    Past Surgical History  Procedure Date  . Lap band 2005  . Penile prosthesis implant     due to bladder cancer  . Bladder surgery     for bladder cancer  . Loop recorder implant     in heart to assess cause of syncope   . Esophagogastroduodenoscopy 02/19/2012    Procedure: ESOPHAGOGASTRODUODENOSCOPY (EGD);  Surgeon: Hart Carwin, MD;  Location: [facility];  Service: Endoscopy;  Laterality: N/A;  . Abdominal surgery   . Penile prosthesis implant   . Bladder cancer 2004  . Loop recorder 2013    Family History  Problem Relation Age of Onset  . Hypertension    . Diabetes    . Cancer Sister 88    deceased  . COPD Mother 84    deceased  . Other Mother      breathing problems - emphzma  . Liver disease Sister 9    deceased    History  Substance Use Topics  . Smoking status: Current Everyday Smoker -- 1.0 packs/day for 65 years  . Smokeless tobacco: Not on file  . Alcohol Use:  No     once in a while - socially      Review of Systems  Constitutional: Negative for fever.  Respiratory: Negative for shortness of breath.   Cardiovascular: Negative for chest pain.  Gastrointestinal: Positive for nausea, abdominal pain, constipation and abdominal distention. Negative for vomiting.  Skin: Negative for rash and wound.  Neurological: Negative for dizziness, weakness, light-headedness and numbness.    Allergies  Review of patient's allergies indicates no known allergies.  Home Medications   No current outpatient prescriptions on file.  BP 100/63  Pulse 74  Temp 98.1 F (36.7 C) (Oral)  Resp 18  Ht 5\' 11"  (1.803 m)  Wt 214 lb 8.1 oz (97.3 kg)  BMI 29.92 kg/m2  SpO2 92%  Physical Exam  Nursing note and vitals reviewed. Constitutional: He is oriented to person, place, and time. He appears well-developed and well-nourished. No distress.  HENT:  Head: Normocephalic and atraumatic.  Mouth/Throat: Oropharynx is clear and moist.  Eyes: EOM are normal. Pupils are equal, round, and reactive to light.  Neck: Normal range of motion. Neck supple.  Cardiovascular: Normal rate and regular rhythm.   Pulmonary/Chest: Effort normal and breath sounds normal. No respiratory distress. He  has no wheezes. He has no rales.  Abdominal: Soft. Bowel sounds are normal. He exhibits distension. There is tenderness (diffuse abd ttp without focality, rebound or guarding). There is no rebound and no guarding.  Musculoskeletal: Normal range of motion. He exhibits no edema and no tenderness.  Neurological: He is alert and oriented to person, place, and time.  Skin: Skin is warm and dry. No rash noted. No erythema.  Psychiatric: He has a normal mood and affect. His behavior is normal.    ED Course  Procedures (including critical care time)  Labs Reviewed  CBC WITH DIFFERENTIAL - Abnormal; Notable for the following:    RBC 4.17 (*)     MCV 103.4 (*)     MCH 36.0 (*)     Platelets  128 (*)     All other components within normal limits  COMPREHENSIVE METABOLIC PANEL - Abnormal; Notable for the following:    Sodium 133 (*)     Albumin 2.9 (*)     AST 180 (*)     ALT 80 (*)     Alkaline Phosphatase 345 (*)     Total Bilirubin 1.9 (*)     GFR calc non Af Amer 78 (*)     All other components within normal limits  URINALYSIS, ROUTINE W REFLEX MICROSCOPIC - Abnormal; Notable for the following:    Color, Urine ORANGE (*)  BIOCHEMICALS MAY BE AFFECTED BY COLOR   Specific Gravity, Urine 1.031 (*)     Bilirubin Urine MODERATE (*)     Ketones, ur TRACE (*)     Protein, ur 30 (*)     Nitrite POSITIVE (*)     Leukocytes, UA SMALL (*)     All other components within normal limits  LIPASE, BLOOD - Abnormal; Notable for the following:    Lipase 89 (*)     All other components within normal limits  COMPREHENSIVE METABOLIC PANEL - Abnormal; Notable for the following:    Albumin 2.5 (*)     AST 150 (*)     ALT 73 (*)     Alkaline Phosphatase 320 (*)     Total Bilirubin 2.2 (*)     GFR calc non Af Amer 69 (*)     GFR calc Af Amer 80 (*)     All other components within normal limits  CBC - Abnormal; Notable for the following:    RBC 3.85 (*)     MCV 103.4 (*)     MCH 35.6 (*)     RDW 15.6 (*)     Platelets 112 (*)     All other components within normal limits  HEPATIC FUNCTION PANEL - Abnormal; Notable for the following:    Albumin 2.5 (*)     AST 160 (*)     ALT 76 (*)     Alkaline Phosphatase 341 (*)     Total Bilirubin 1.9 (*)     Bilirubin, Direct 1.0 (*)     All other components within normal limits  CANCER ANTIGEN 19-9 - Abnormal; Notable for the following:    CA 19-9 13.2 (*)     All other components within normal limits  BODY FLUID CELL COUNT WITH DIFFERENTIAL - Abnormal; Notable for the following:    Color, Fluid STRAW (*)     All other components within normal limits  LACTATE DEHYDROGENASE, BODY FLUID - Abnormal; Notable for the following:    LD, Fluid  41 (*)  All other components within normal limits  COMPREHENSIVE METABOLIC PANEL - Abnormal; Notable for the following:    Glucose, Bld 107 (*)     Albumin 2.6 (*)     AST 157 (*)     ALT 71 (*)     Alkaline Phosphatase 333 (*)     Total Bilirubin 1.5 (*)     GFR calc non Af Amer 62 (*)     GFR calc Af Amer 72 (*)     All other components within normal limits  CBC - Abnormal; Notable for the following:    RBC 4.03 (*)     MCV 104.0 (*)     MCH 35.7 (*)     RDW 15.7 (*)     Platelets 109 (*)  CONSISTENT WITH PREVIOUS RESULT   All other components within normal limits  CBC - Abnormal; Notable for the following:    RBC 3.79 (*)     HCT 38.9 (*)     MCV 102.6 (*)     MCH 35.6 (*)     RDW 16.0 (*)     Platelets 107 (*)  CONSISTENT WITH PREVIOUS RESULT   All other components within normal limits  CBC - Abnormal; Notable for the following:    RBC 3.78 (*)     MCV 104.8 (*)     MCH 35.7 (*)     RDW 15.8 (*)     Platelets 131 (*)     All other components within normal limits  BASIC METABOLIC PANEL - Abnormal; Notable for the following:    Sodium 133 (*)     Glucose, Bld 124 (*)     BUN 32 (*)     Creatinine, Ser 1.50 (*)     GFR calc non Af Amer 44 (*)     GFR calc Af Amer 51 (*)     All other components within normal limits  URINE MICROSCOPIC-ADD ON  URINE CULTURE  HEPATITIS PANEL, ACUTE  HIV ANTIBODY (ROUTINE TESTING)  AFP TUMOR MARKER  CEA  AMMONIA  PROTIME-INR  BODY FLUID CULTURE  PROTEIN, BODY FLUID  PATHOLOGIST SMEAR REVIEW  PROTIME-INR  CYTOLOGY - NON PAP  PROTIME-INR  OCCULT BLOOD X 1 CARD TO LAB, STOOL  CYTOLOGY - NON PAP  SURGICAL PATHOLOGY  SURGICAL PATHOLOGY  LAB REPORT - SCANNED   Ct Head Wo Contrast  02/27/2012  *RADIOLOGY REPORT*  Clinical Data: Altered mental status  CT HEAD WITHOUT CONTRAST  Technique:  Contiguous axial images were obtained from the base of the skull through the vertex without contrast.  Comparison: CT 02/17/2012  Findings:  Moderate chronic ischemic changes in the white matter. Small chronic infarct in the right frontal cortex.  Small chronic infarct in the right parietal cortex.  These are unchanged. Negative for acute infarct.  Negative for hemorrhage or mass.  Ossification of the transverse ligament causing spinal stenosis at the C1 level.  This is unchanged from the  prior study.  IMPRESSION: Chronic ischemic changes.  No acute intracranial abnormality.  Original Report Authenticated By: Camelia Phenes, M.D.   Ct Abdomen Pelvis W Contrast  02/27/2012  *RADIOLOGY REPORT*  Clinical Data: Abdominal pain.  Recent liver biopsy showing bile duct cancer.  Elevated lipase.  CT ABDOMEN AND PELVIS WITH CONTRAST  Technique:  Multidetector CT imaging of the abdomen and pelvis was performed following the standard protocol during bolus administration of intravenous contrast.  Contrast:  100 ml Omnipaque-300 IV  Comparison: CT 02/16/2012  Findings: Infiltrative mass in the right lobe of the liver is unchanged from the prior CT.  This measures approximately 6 x 17 cm.  Calcified small gallstones are present.  No masses identified within the gallbladder.  Moderate to large ascites has increased in the interval.  No subcapsular hematoma is identified.  There are findings compatible with cirrhosis with irregular contour of the liver and enlargement of the caudate lobe.  This is unchanged from the  prior study. Probable portal hypertension is present.  The pancreas is normal.  Spleen is normal in size.  Nonobstructing left renal stone.  No hydronephrosis or renal mass.  Left renal cyst complex cyst is noted.  Negative for bowel obstruction.  Sigmoid diverticulosis.  Gastric banding procedure has been performed with gastric band in satisfactory position.  Moderate to severe compression fracture of L1 is unchanged from prior study.  Diffuse aortic atherosclerotic disease is present without aneurysm.  IMPRESSION: Infiltrative mass in the liver has  been recently biopsied showing carcinoma.  This is unchanged.  No evidence of subcapsular hematoma.  Moderate to large amount of ascites, with increased from the recent CT of 02/16/2012.  Cirrhosis and portal hypertension.  Normal appearing pancreas. Gallstones  Original Report Authenticated By: Camelia Phenes, M.D.     1. Liver mass   2. UTI (lower urinary tract infection)   3. Gallstones   4. Arthritis   5. Hyponatremia   6. Liver tumor   7. Nonspecific (abnormal) findings on radiological and other examination of biliary tract   8. Nonspecific elevation of levels of transaminase or lactic acid dehydrogenase (LDH)   9. Vomiting   10. Esophageal varices   11. History of laparoscopic adjustable gastric banding   12. Cholelithiasis without obstruction   13. Neuropathy   14. Cholangiocarcinoma   15. High blood pressure   16. 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   17. Ascites, malignant   18. Physical deconditioning       MDM   Discussed with pt his CT findings and concern for hepatic carcinoma. Will get U/S to r/o acute cholecystitis.        Loren Racer, MD 02/27/12 [phone]

## 2012-02-16 NOTE — Progress Notes (Signed)
  HISTORY: Lance Best is a 78 y.o.male who received an 10 cm lap-band in August 2007 in [place]. He was last seen by Dr. Daphine Deutscher 2 weeks ago for dysphagia for which 0.3 mL was removed from his band. Since, he's experienced abdominal bloating, distension, morning nausea and frequent, tiny loose bowel movements. He denies fever or vomiting.  VITAL SIGNS: Filed Vitals:   02/16/12 1357  BP: 134/86  Pulse: 88  Temp: 97.6 F (36.4 C)  Resp: 18    PHYSICAL EXAM: Physical exam reveals a very well-appearing 78 y.o.male in no apparent distress Neurologic: Awake, alert, oriented Psych: Bright affect, conversant Respiratory: Breathing even and unlabored. No stridor or wheezing Abdomen: Soft, mildly distended, nontender. No rebound. Incisions intact. Extremities: Atraumatic, good range of motion. Skin: Warm, Dry, no rashes Musculoskeletal: Normal gait, Joints normal  ASSESMENT: 78 y.o.  male  s/p 10 cm lap-band.   PLAN: Will first obtain abdominal series. Suspicion of constipation. Will follow-up on imaging today and will see the patient again next week. He is scheduled to see his primary tomorrow. He is to return should symptoms worsen.

## 2012-02-16 NOTE — Telephone Encounter (Signed)
Lance Best sent pt for abd series; Marcelino Duster, at [facility], calling with results:  Possible developing small bowel obstruction; consider CT abdomen and pelvis with contrast for further evaluation.  She states the pt is still there and in some small to moderate amount of pain.  Asked Dr. Biagio Quint to review and he recommends pt go to [facility] ER for Dr. Johna Sheriff to evaluate and, if needed, to CT there.  Called wife at home, per pt's request, and gave her the update.  She understands and will see that he goes to [facility] this evening.  Will page and update Dr. Johna Sheriff as well.

## 2012-02-16 NOTE — Progress Notes (Signed)
The patient was not seen by me.  He was complaining of abdominal distension and nausea.  xrays with possible early bowel obstruction.  We recommended that he go to the ER for evaluation for possible bowel obstruction.

## 2012-02-16 NOTE — ED Notes (Signed)
Given a urinal  

## 2012-02-16 NOTE — Patient Instructions (Signed)
Attend x-ray today. Follow-up with your primary physician tomorrow. Return next Thursday or sooner if worse, especially if you have increasing pain, vomiting or fever.

## 2012-02-16 NOTE — ED Notes (Addendum)
Had an outpatient xray done for abd bloating today- sent here for CT scan, had bariatric surgery >5 years ago, had 3cc removed from lapband two weeks ago, abd started bloating after that. [name] is supposed to see Dr. Johna Sheriff. Denies any nausea/vomiting.

## 2012-02-17 ENCOUNTER — Inpatient Hospital Stay (HOSPITAL_COMMUNITY): Payer: Medicare Other

## 2012-02-17 ENCOUNTER — Encounter (HOSPITAL_COMMUNITY): Payer: Self-pay

## 2012-02-17 DIAGNOSIS — K769 Liver disease, unspecified: Secondary | ICD-10-CM

## 2012-02-17 DIAGNOSIS — N390 Urinary tract infection, site not specified: Secondary | ICD-10-CM | POA: Diagnosis present

## 2012-02-17 DIAGNOSIS — R1110 Vomiting, unspecified: Secondary | ICD-10-CM | POA: Diagnosis present

## 2012-02-17 DIAGNOSIS — E871 Hypo-osmolality and hyponatremia: Secondary | ICD-10-CM | POA: Diagnosis present

## 2012-02-17 DIAGNOSIS — I8500 Esophageal varices without bleeding: Secondary | ICD-10-CM

## 2012-02-17 DIAGNOSIS — D490 Neoplasm of unspecified behavior of digestive system: Secondary | ICD-10-CM

## 2012-02-17 DIAGNOSIS — K8020 Calculus of gallbladder without cholecystitis without obstruction: Secondary | ICD-10-CM | POA: Diagnosis present

## 2012-02-17 DIAGNOSIS — R932 Abnormal findings on diagnostic imaging of liver and biliary tract: Secondary | ICD-10-CM

## 2012-02-17 LAB — CBC
HCT: 39.8 % (ref 39.0–52.0)
Hemoglobin: 13.7 g/dL (ref 13.0–17.0)
MCHC: 34.4 g/dL (ref 30.0–36.0)
RBC: 3.85 MIL/uL — ABNORMAL LOW (ref 4.22–5.81)

## 2012-02-17 LAB — HEPATIC FUNCTION PANEL
ALT: 76 U/L — ABNORMAL HIGH (ref 0–53)
AST: 160 U/L — ABNORMAL HIGH (ref 0–37)
Bilirubin, Direct: 1 mg/dL — ABNORMAL HIGH (ref 0.0–0.3)
Total Bilirubin: 1.9 mg/dL — ABNORMAL HIGH (ref 0.3–1.2)

## 2012-02-17 LAB — COMPREHENSIVE METABOLIC PANEL
ALT: 73 U/L — ABNORMAL HIGH (ref 0–53)
AST: 150 U/L — ABNORMAL HIGH (ref 0–37)
Calcium: 8.8 mg/dL (ref 8.4–10.5)
Creatinine, Ser: 1.03 mg/dL (ref 0.50–1.35)
GFR calc Af Amer: 80 mL/min — ABNORMAL LOW (ref >90)
GFR calc non Af Amer: 69 mL/min — ABNORMAL LOW (ref >90)
Glucose, Bld: 88 mg/dL (ref 70–99)
Sodium: 136 mEq/L (ref 135–145)
Total Protein: 6.3 g/dL (ref 6.0–8.3)

## 2012-02-17 LAB — URINE CULTURE: Colony Count: NO GROWTH

## 2012-02-17 LAB — PROTIME-INR: Prothrombin Time: 13.9 seconds (ref 11.6–15.2)

## 2012-02-17 MED ORDER — SPIRONOLACTONE 100 MG PO TABS
100.0000 mg | ORAL_TABLET | Freq: Every day | ORAL | Status: DC
  Administered 2012-02-17 – 2012-02-20 (×4): 100 mg via ORAL
  Filled 2012-02-17 (×4): qty 1

## 2012-02-17 MED ORDER — MELOXICAM 7.5 MG PO TABS
7.5000 mg | ORAL_TABLET | Freq: Two times a day (BID) | ORAL | Status: DC
  Filled 2012-02-17 (×2): qty 1

## 2012-02-17 MED ORDER — IOHEXOL 300 MG/ML  SOLN
100.0000 mL | Freq: Once | INTRAMUSCULAR | Status: AC | PRN
  Administered 2012-02-17: 100 mL via INTRAVENOUS

## 2012-02-17 MED ORDER — FLUTICASONE PROPIONATE 50 MCG/ACT NA SUSP
1.0000 | Freq: Every day | NASAL | Status: DC
  Filled 2012-02-17: qty 16

## 2012-02-17 MED ORDER — FUROSEMIDE 40 MG PO TABS
40.0000 mg | ORAL_TABLET | Freq: Every day | ORAL | Status: DC
  Administered 2012-02-17 – 2012-02-18 (×2): 40 mg via ORAL
  Filled 2012-02-17 (×2): qty 1

## 2012-02-17 MED ORDER — MORPHINE SULFATE 2 MG/ML IJ SOLN
2.0000 mg | Freq: Once | INTRAMUSCULAR | Status: AC
  Administered 2012-02-17: 2 mg via INTRAVENOUS
  Filled 2012-02-17: qty 1

## 2012-02-17 MED ORDER — ZOLPIDEM TARTRATE 10 MG PO TABS
10.0000 mg | ORAL_TABLET | Freq: Every evening | ORAL | Status: DC | PRN
  Administered 2012-02-20: 10 mg via ORAL
  Filled 2012-02-17: qty 1

## 2012-02-17 MED ORDER — ACETAMINOPHEN 650 MG RE SUPP: 650.0000 mg | Freq: Four times a day (QID) | RECTAL | Status: DC | PRN

## 2012-02-17 MED ORDER — TAMSULOSIN HCL 0.4 MG PO CAPS
0.4000 mg | ORAL_CAPSULE | Freq: Every day | ORAL | Status: DC
  Administered 2012-02-18 – 2012-02-23 (×6): 0.4 mg via ORAL
  Filled 2012-02-17 (×8): qty 1

## 2012-02-17 MED ORDER — PANTOPRAZOLE SODIUM 40 MG PO TBEC
80.0000 mg | DELAYED_RELEASE_TABLET | Freq: Every day | ORAL | Status: DC
  Administered 2012-02-18 – 2012-02-23 (×6): 80 mg via ORAL
  Filled 2012-02-17 (×7): qty 2

## 2012-02-17 MED ORDER — HYDROMORPHONE HCL PF 1 MG/ML IJ SOLN
1.0000 mg | INTRAMUSCULAR | Status: DC | PRN
  Administered 2012-02-17 – 2012-02-22 (×4): 1 mg via INTRAVENOUS
  Filled 2012-02-17 (×5): qty 1

## 2012-02-17 MED ORDER — PROMETHAZINE HCL 25 MG PO TABS: 12.5000 mg | ORAL_TABLET | Freq: Four times a day (QID) | ORAL | Status: DC | PRN

## 2012-02-17 MED ORDER — POTASSIUM CHLORIDE IN NACL 20-0.9 MEQ/L-% IV SOLN
INTRAVENOUS | Status: DC
  Administered 2012-02-17 (×2): via INTRAVENOUS
  Filled 2012-02-17 (×4): qty 1000

## 2012-02-17 MED ORDER — EZETIMIBE-SIMVASTATIN 10-20 MG PO TABS
1.0000 | ORAL_TABLET | Freq: Every day | ORAL | Status: DC
  Administered 2012-02-18 – 2012-02-19 (×2): 1 via ORAL
  Filled 2012-02-17 (×3): qty 1

## 2012-02-17 MED ORDER — ENOXAPARIN SODIUM 40 MG/0.4ML ~~LOC~~ SOLN
40.0000 mg | SUBCUTANEOUS | Status: DC
Start: 2012-02-17 — End: 2012-02-20
  Administered 2012-02-17 – 2012-02-20 (×4): 40 mg via SUBCUTANEOUS
  Filled 2012-02-17 (×4): qty 0.4

## 2012-02-17 MED ORDER — ACETAMINOPHEN 325 MG PO TABS: 650.0000 mg | ORAL_TABLET | Freq: Four times a day (QID) | ORAL | Status: DC | PRN

## 2012-02-17 MED ORDER — TRAMADOL HCL 50 MG PO TABS
50.0000 mg | ORAL_TABLET | Freq: Four times a day (QID) | ORAL | Status: DC | PRN
  Filled 2012-02-17: qty 1

## 2012-02-17 MED ORDER — DEXTROSE 5 % IV SOLN
1.0000 g | Freq: Every day | INTRAVENOUS | Status: DC
  Administered 2012-02-17 – 2012-02-18 (×2): 1 g via INTRAVENOUS
  Filled 2012-02-17 (×3): qty 10

## 2012-02-17 MED ORDER — ALUM & MAG HYDROXIDE-SIMETH 200-200-20 MG/5ML PO SUSP: 30.0000 mL | Freq: Four times a day (QID) | ORAL | Status: DC | PRN

## 2012-02-17 MED ORDER — FLUTICASONE PROPIONATE 50 MCG/ACT NA SUSP
1.0000 | Freq: Every day | NASAL | Status: DC
  Administered 2012-02-17 – 2012-02-22 (×5): 1 via NASAL
  Filled 2012-02-17 (×2): qty 16

## 2012-02-17 MED ORDER — DOCUSATE SODIUM 100 MG PO CAPS
100.0000 mg | ORAL_CAPSULE | Freq: Every day | ORAL | Status: DC | PRN
  Filled 2012-02-17: qty 1

## 2012-02-17 MED ORDER — GABAPENTIN 300 MG PO CAPS
600.0000 mg | ORAL_CAPSULE | Freq: Three times a day (TID) | ORAL | Status: DC
  Administered 2012-02-17 – 2012-02-23 (×16): 600 mg via ORAL
  Filled 2012-02-17 (×23): qty 2

## 2012-02-17 NOTE — Care Management Note (Signed)
    Page 1 of 1   02/17/2012     3:32:35 PM   CARE MANAGEMENT NOTE 02/17/2012  Patient:  Lance Best, Lance Best   Account Number:  [id number]  Date Initiated:  02/17/2012  Documentation initiated by:  Lorenda Ishihara  Subjective/Objective Assessment:   78 yo male admitted with abd pain, early SBO, UTI, liver tumor. PTA lived at home with spouse.     Action/Plan:   Anticipated DC Date:  02/21/2012   Anticipated DC Plan:  HOME/SELF CARE      DC Planning Services  CM consult      Choice offered to / List presented to:             Status of service:  In process, will continue to follow Medicare Important Message given?   (If response is "NO", the following Medicare IM given date fields will be blank) Date Medicare IM given:   Date Additional Medicare IM given:    Discharge Disposition:    Per UR Regulation:  Reviewed for med. necessity/level of care/duration of stay  If discussed at Long Length of Stay Meetings, dates discussed:    Comments:

## 2012-02-17 NOTE — ED Provider Notes (Signed)
Results for orders placed during the hospital encounter of 02/16/12  CBC WITH DIFFERENTIAL      Component Value Range   WBC 8.1  4.0 - 10.5 K/uL   RBC 4.17 (*) 4.22 - 5.81 MIL/uL   Hemoglobin 15.0  13.0 - 17.0 g/dL   HCT 16.1  09.6 - 04.5 %   MCV 103.4 (*) 78.0 - 100.0 fL   MCH 36.0 (*) 26.0 - 34.0 pg   MCHC 34.8  30.0 - 36.0 g/dL   RDW 40.9  81.1 - 91.4 %   Platelets 128 (*) 150 - 400 K/uL   Neutrophils Relative 67  43 - 77 %   Neutro Abs 5.4  1.7 - 7.7 K/uL   Lymphocytes Relative 21  12 - 46 %   Lymphs Abs 1.7  0.7 - 4.0 K/uL   Monocytes Relative 12  3 - 12 %   Monocytes Absolute 1.0  0.1 - 1.0 K/uL   Eosinophils Relative 1  0 - 5 %   Eosinophils Absolute 0.0  0.0 - 0.7 K/uL   Basophils Relative 0  0 - 1 %   Basophils Absolute 0.0  0.0 - 0.1 K/uL  COMPREHENSIVE METABOLIC PANEL      Component Value Range   Sodium 133 (*) 135 - 145 mEq/L   Potassium 4.0  3.5 - 5.1 mEq/L   Chloride 98  96 - 112 mEq/L   CO2 25  19 - 32 mEq/L   Glucose, Bld 70  70 - 99 mg/dL   BUN 20  6 - 23 mg/dL   Creatinine, Ser 7.82  0.50 - 1.35 mg/dL   Calcium 9.1  8.4 - 95.6 mg/dL   Total Protein 7.1  6.0 - 8.3 g/dL   Albumin 2.9 (*) 3.5 - 5.2 g/dL   AST 213 (*) 0 - 37 U/L   ALT 80 (*) 0 - 53 U/L   Alkaline Phosphatase 345 (*) 39 - 117 U/L   Total Bilirubin 1.9 (*) 0.3 - 1.2 mg/dL   GFR calc non Af Amer 78 (*) >90 mL/min   GFR calc Af Amer >90  >90 mL/min  URINALYSIS, ROUTINE W REFLEX MICROSCOPIC      Component Value Range   Color, Urine ORANGE (*) YELLOW   APPearance CLEAR  CLEAR   Specific Gravity, Urine 1.031 (*) 1.005 - 1.030   pH 6.0  5.0 - 8.0   Glucose, UA NEGATIVE  NEGATIVE mg/dL   Hgb urine dipstick NEGATIVE  NEGATIVE   Bilirubin Urine MODERATE (*) NEGATIVE   Ketones, ur TRACE (*) NEGATIVE mg/dL   Protein, ur 30 (*) NEGATIVE mg/dL   Urobilinogen, UA 1.0  0.0 - 1.0 mg/dL   Nitrite POSITIVE (*) NEGATIVE   Leukocytes, UA SMALL (*) NEGATIVE  LIPASE, BLOOD      Component Value Range   Lipase 89 (*) 11 - 59 U/L  URINE MICROSCOPIC-ADD ON      Component Value Range   Squamous Epithelial / LPF RARE  RARE   WBC, UA 0-2  <3 WBC/hpf   Bacteria, UA RARE  RARE   Urine-Other MUCOUS PRESENT     US Abdomen Complete  02/17/2012  *RADIOLOGY REPORT*  Clinical Data:  Abdominal pain  ABDOMINAL ULTRASOUND COMPLETE  Comparison:  02/2012 CT  Findings:  Gallbladder:  Cholelithiasis noted.  Dominant echogenic shadowing gallstone measures 4.5 cm.  Normal wall thickness measuring 2.3 mm. No Murphy's sign.  No evidence of cholecystitis.  Common Bile Duct:  Within normal limits  in caliber.  Liver: Diffusely heterogeneous and echogenic anterior right liver and left hepatic lobe, concerning for underlying infiltrative tumor process suchas HCC when correlating with the CT findings. Perihepatic ascites noted.  No biliary dilatation.  IVC:  Appears normal.  Pancreas:  No abnormality identified.  Spleen:  Within normal limits in size and echotexture.  Right kidney:  Normal in size and parenchymal echogenicity.  No evidence of mass or hydronephrosis.  Left kidney:  Normal in size and parenchymal echogenicity.  Upper pole lateral hypoechoic cyst is noted with increased through transmission measuring 2 cm.  Abdominal Aorta:  Atherosclerotic changes.  Negative for aneurysm.  IMPRESSION: Abnormal heterogeneous liver with increased echogenicity throughout the anterior right lobe and the left lobe concerning for an infiltrative tumor process when compared to the CT.  Abdominal ascites  Cholelithiasis but no evidence of wall thickening or Murphy's sign to suggest acute cholecystitis  No biliary dilatation  Left renal cyst  Original Report Authenticated By: Judie Petit. Ruel Favors, M.D.   Ct Abdomen Pelvis W Contrast  02/16/2012  *RADIOLOGY REPORT*  Clinical Data: Abdominal distension and pain.  CT ABDOMEN AND PELVIS WITH CONTRAST  Technique:  Multidetector CT imaging of the abdomen and pelvis was performed following the standard  protocol during bolus administration of intravenous contrast.  Contrast: OMNIPAQUE IOHEXOL 300 MG/ML  SOLN  Comparison: No priors.  Findings:  Lung Bases: Scarring or subsegmental atelectasis in the left lower lobe. There is atherosclerosis of the thoracic aorta and the coronary arteries, including calcified atherosclerotic plaque in the the left main, left anterior descending, left circumflex and right coronary arteries.  Abdomen/Pelvis:  There is a diffusely infiltrative mass in the liver involving segments II, III, IVa/IVb, V and VIII. This mass is heterogeneous in appearance with multifocal areas of low attenuation and heterogeneous enhancement.  The liver generally has a slightly shrunken and nodular contour in the area of the mass, but there appears to be some mild hypertrophy of the right lobe and definite hypertrophy of the caudate lobe.  Numerous gallstones are noted dependently within the lumen of the gallbladder.  The gallbladder wall appears mildly thickened, and there is a small amount of pericholecystic fluid, however, the overall appearance is not strongly suggestive of acute cholecystitis (findings are compounded by the presence of the surrounding ascites).  The appearance of the pancreas, spleen and bilateral adrenal glands is unremarkable.  Mild parenchymal thinning is noted at multiple regions in the kidneys bilaterally, which likely reflects some scarring.  2.2 cm exophytic cyst in the interpolar region of the left kidney.  There is a densely calcified lesion extending off the upper pole of the left kidney.  A nonobstructive 6 mm calculus is noted in the interpolar collecting system of the left kidney.  Moderate - large volume of ascites. Extensive atherosclerosis throughout the abdominal and pelvic vasculature, without definite aneurysm or dissection.  Status post lap band procedure.  Numerous colonic diverticulae.  Given the presence of the ascites, accurate assessment for signs of acute  colonic diverticulitis is not possible on today's examination.  Numerous dilated perianal and perirectal veins, compatible with hemorrhoids.  Urinary bladder is unremarkable.  A penile prosthesis is noted with a reservoir in the lower left anatomic pelvis.  Musculoskeletal: Diffuse body wall edema There are no aggressive appearing lytic or blastic lesions noted in the visualized portions of the skeleton.  Compression fracture at L1 with approximately 50% loss of anterior vertebral body height has an appearance suggestive of old  injury.  IMPRESSION: 1.  The appearance of the liver is highly concerning for an infiltrative neoplasm, and given and the findings of cirrhosis, is most concerning for a diffusely infiltrative hepatocellular carcinoma. 2.  Moderate - large volume of ascites. 3.  Cholelithiasis.  Accurate assessment for acute cholecystitis is not possible on this examination given the adjacent ascites. 4.  Colonic diverticulosis.  Accurate assessment for acute diverticulitis is not possible on this examination given the presence of the ascites. 5. Atherosclerosis, including left main and three-vessel coronary artery disease. 6.  Additional incidental findings, as above.  Original Report Authenticated By: Florencia Reasons, M.D.   Dg Abd 2 Views  02/16/2012  *RADIOLOGY REPORT*  Clinical Data: Abdominal bloating, history of lap band  ABDOMEN - 2 VIEW  Comparison: Lumbar spine films of 01/18/2011  Findings: Although the orientation of the lap band is rather vertical, this does not appear to have changed when compared to the prior lumbar spine view from July 2012.  No free air is seen on the erect view.  However, on the supine views there is dilatation of a loop of small bowel in the left abdomen up to 4 cm.  This is worrisome for developing partial small bowel obstruction.  If further assessment is warranted CT of the abdomen pelvis with IV contrast media is recommended.  No colonic distention is seen.  The  bones are osteopenic and there are diffuse degenerative changes throughout the lumbar spine.  IMPRESSION:  1. Possible developing partial small bowel obstruction.  Consider CT abdomen and pelvis with IV contrast to assess further. 2.  No free air. 3.  No change in somewhat vertical orientation of the lap band.  Original Report Authenticated By: Juline Patch, M.D.    Repeat morphine for persistent pain.  12:52 AM d/w Dr Johna Sheriff on call for [facility] since PT was sent here from [facility] clinic.   IV ABx. MED c/s is establishing new PCP, no local oncologist  2:01 AM d/w [facility] hospital[facility] - will admit  Sunnie Nielsen, MD 02/17/12 0202

## 2012-02-17 NOTE — Progress Notes (Signed)
5:04 PM I agree with HPI/GPe and A/P per Dr. Adrian Blackwater  Wife relates 2 mo abd swelling, n/v and today has started to have diarrhoea      HEENT-alert, oriented, temporal wasting CTa b, no added sound CARDIAC-s1 s2 no added sound mno m/r/g ABDOMEN-distended tympanitic, flank fullness and shifting dullness + NEURO-NO asterixis, no clonus SKIN/MUSCULAR-No splotches  Patient Active Problem List  Diagnosis  . Syncope-presumed neurally mediated  . Neuropathy-diabetic  . Wears glasses  . Arthritis  . Wears dentures  . High blood pressure  . History of laparoscopic adjustable gastric banding  . 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  . UTI (lower urinary tract infection)  . Liver tumor  . Vomiting  . Hyponatremia  . Cholelithiasis without obstruction   A/p IR paracentesis-get SAAG, Culture and Cell type GI-Dr. [name] consulted-recommends lab work up (Ca19, CEA,AFP) will eval in am Start lactulose 30 bid Get Cdiff PCR (relaitvely immunocompromised) Has h/o Transitional cell Ca Rpt INR/CMET/CBC c Diff and coags am-potentially needs US guided Biospy.  Discussed full POC c family  Pleas Koch, MD [facility] [phone]

## 2012-02-17 NOTE — Consult Note (Signed)
Subjective According to the wife pt has been weak x 2 months, N&V,   Objective:new diagnosis liver disease by abdominal ultrasound and CT scan, abnormal liver parenchymal on imaging suggestive of neoplastic process, new onset ascites, retired from [employer]- got tattoos left arm, denies hepatitis, drugs, worked as a Midwife for [employer] used to drink alcohol till about 30'ies, none since. Lap-band 6 years ago in [place], lost 100lbs and maintained Vital signs in last 24 hours: Temp:  [97.7 F (36.5 C)-98.8 F (37.1 C)] 98.8 F (37.1 C) (08/09 2045) Pulse Rate:  [77-87] 87  (08/09 2045) Resp:  [16-18] 18  (08/09 2045) BP: (123-162)/(73-94) 123/73 mmHg (08/09 2045) SpO2:  [94 %-95 %] 94 % (08/09 2045) Weight:  [217 lb (98.431 kg)] 217 lb (98.431 kg) (08/09 0300) Last BM Date: 02/16/12 General:   Alert,  pleasant, cooperative in NAD Head:  Normocephalic and atraumatic. Eyes:  Sclera clear, no icterus.   Conjunctiva pink. Mouth:  No deformity or lesions, dentition normal. Neck:  Supple; no masses or thyromegaly. Heart:  Regular rate and rhythm; no murmurs, clicks, rubs,  or gallops. Lungs:  No wheezes or rales Abdomen:  Markedly protuberant ,non tender, normal bowl sounds, firm nontender liver edge  Left lobe of the liver, positive fluid wave Msk:  Symmetrical without gross deformities. Normal posture. Pulses:  Normal pulses noted. Extremities:  Without clubbing , trace edema. Neurologic:  Alert and  oriented x4;  grossly normal neurologically., no asterixis Skin:  Intact without significant lesions or rashes.,tattoos left arm  Intake/Output from previous day: 08/08 0701 - 08/09 0700 In: -  Out: 500 [Urine:500] Intake/Output this shift:    Lab Results:  Basename 02/17/12 0440 02/16/12 1953  WBC 6.7 8.1  HGB 13.7 15.0  HCT 39.8 43.1  PLT 112* 128*   BMET  Basename 02/17/12 0440 02/16/12 1953  NA 136 133*  K 3.9 4.0  CL 101 98  CO2 27 25  GLUCOSE 88 70  BUN 17 20    CREATININE 1.03 0.98  CALCIUM 8.8 9.1   LFT  Basename 02/17/12 1744  PROT 6.4  ALBUMIN 2.5*  AST 160*  ALT 76*  ALKPHOS 341*  BILITOT 1.9*  BILIDIR 1.0*  IBILI 0.9   PT/INR  Basename 02/17/12 1933  LABPROT 13.9  INR 1.05   Hepatitis Panel No results found for this basename: HEPBSAG,HCVAB,HEPAIGM,HEPBIGM in the last 72 hours  Studies/Results: Ct Head W Wo Contrast  02/17/2012  *RADIOLOGY REPORT*  Clinical Data: New diagnosis liver cancer.  CT HEAD WITHOUT AND WITH CONTRAST  Technique:  Contiguous axial images were obtained from the base of the skull through the vertex without and with intravenous contrast.  Contrast: OMNIPAQUE IOHEXOL 300 MG/ML  SOLN  Comparison: 10/23/2010  Findings: Extensive low density throughout the periventricular and deep white matter, likely chronic ischemic changes, stable since prior study.  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  No enhancing lesions.  No acute infarction or hemorrhage.  No hydrocephalus.  No midline shift.  IMPRESSION: Advanced chronic small vessel disease.  Mild atrophy.  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  No acute findings.  Original Report Authenticated By: Cyndie Chime, M.D.   Ct Chest W Contrast  02/17/2012  *RADIOLOGY REPORT*  Clinical Data: Liver tumors.  Evaluate for metastatic disease.  CT CHEST WITH CONTRAST  Technique:  Multidetector CT imaging of the chest was performed following the standard protocol during bolus administration of intravenous contrast.  Contrast:  OMNIPAQUE IOHEXOL 300 MG/ML  SOLN  Comparison: CT of the chest 10/23/2010.  Findings:  Mediastinum: Heart size is normal. There is no significant pericardial fluid, thickening or pericardial calcification. There is atherosclerosis of the thoracic aorta, the great vessels of the mediastinum and the coronary arteries, including calcified atherosclerotic plaque in the left main, left anterior descending, left circumflex and right  coronary arteries. Postoperative changes of lap band procedure near the gastroesophageal junction. No pathologically enlarged mediastinal or hilar lymph nodes.  Lungs/Pleura: Subsegmental atelectasis and/or scarring in the left lower lobe.  There are a few tiny subpleural nodules in the posterior aspect of the right lung, several which are calcified; these are tiny and nonspecific, favored to represent either granulomas or subpleural lymph nodes.  No other larger more suspicious appearing pulmonary nodules or masses are otherwise identified to suggest presence of metastatic disease to the lungs on today's examination.  No acute consolidative airspace disease. Trace left pleural effusion.  Upper Abdomen: Please see dictation from CT scan of 02/16/2012 for full description of upper abdominal findings.  Musculoskeletal: There are no aggressive appearing lytic or blastic lesions noted in the visualized portions of the skeleton. Compression fracture of L1 with approximately 50% loss of anterior vertebral body height is unchanged.  IMPRESSION: 1.  No findings to suggest metastatic disease to the thorax on today's examination. 2. Atherosclerosis, including left main and three-vessel coronary artery disease. 3.  Subsegmental atelectasis and/or scarring in the left lower lobe. 4.  Additional incidental findings, as above.  Original Report Authenticated By: Florencia Reasons, M.D.   US Abdomen Complete  02/17/2012  *RADIOLOGY REPORT*  Clinical Data:  Abdominal pain  ABDOMINAL ULTRASOUND COMPLETE  Comparison:  02/2012 CT  Findings:  Gallbladder:  Cholelithiasis noted.  Dominant echogenic shadowing gallstone measures 4.5 cm.  Normal wall thickness measuring 2.3 mm. No Murphy's sign.  No evidence of cholecystitis.  Common Bile Duct:  Within normal limits in caliber.  Liver: Diffusely heterogeneous and echogenic anterior right liver and left hepatic lobe, concerning for underlying infiltrative tumor process suchas HCC when  correlating with the CT findings. Perihepatic ascites noted.  No biliary dilatation.  IVC:  Appears normal.  Pancreas:  No abnormality identified.  Spleen:  Within normal limits in size and echotexture.  Right kidney:  Normal in size and parenchymal echogenicity.  No evidence of mass or hydronephrosis.  Left kidney:  Normal in size and parenchymal echogenicity.  Upper pole lateral hypoechoic cyst is noted with increased through transmission measuring 2 cm.  Abdominal Aorta:  Atherosclerotic changes.  Negative for aneurysm.  IMPRESSION: Abnormal heterogeneous liver with increased echogenicity throughout the anterior right lobe and the left lobe concerning for an infiltrative tumor process when compared to the CT.  Abdominal ascites  Cholelithiasis but no evidence of wall thickening or Murphy's sign to suggest acute cholecystitis  No biliary dilatation  Left renal cyst  Original Report Authenticated By: Judie Petit. Ruel Favors, M.D.   Ct Abdomen Pelvis W Contrast  02/16/2012  *RADIOLOGY REPORT*  Clinical Data: Abdominal distension and pain.  CT ABDOMEN AND PELVIS WITH CONTRAST  Technique:  Multidetector CT imaging of the abdomen and pelvis was performed following the standard protocol during bolus administration of intravenous contrast.  Contrast: OMNIPAQUE IOHEXOL 300 MG/ML  SOLN  Comparison: No priors.  Findings:  Lung Bases: Scarring or subsegmental atelectasis in the left lower lobe. There is atherosclerosis of the thoracic aorta and the coronary arteries, including calcified atherosclerotic plaque in the the  left main, left anterior descending, left circumflex and right coronary arteries.  Abdomen/Pelvis:  There is a diffusely infiltrative mass in the liver involving segments II, III, IVa/IVb, V and VIII. This mass is heterogeneous in appearance with multifocal areas of low attenuation and heterogeneous enhancement.  The liver generally has a slightly shrunken and nodular contour in the area of the mass, but  there appears to be some mild hypertrophy of the right lobe and definite hypertrophy of the caudate lobe.  Numerous gallstones are noted dependently within the lumen of the gallbladder.  The gallbladder wall appears mildly thickened, and there is a small amount of pericholecystic fluid, however, the overall appearance is not strongly suggestive of acute cholecystitis (findings are compounded by the presence of the surrounding ascites).  The appearance of the pancreas, spleen and bilateral adrenal glands is unremarkable.  Mild parenchymal thinning is noted at multiple regions in the kidneys bilaterally, which likely reflects some scarring.  2.2 cm exophytic cyst in the interpolar region of the left kidney.  There is a densely calcified lesion extending off the upper pole of the left kidney.  A nonobstructive 6 mm calculus is noted in the interpolar collecting system of the left kidney.  Moderate - large volume of ascites. Extensive atherosclerosis throughout the abdominal and pelvic vasculature, without definite aneurysm or dissection.  Status post lap band procedure.  Numerous colonic diverticulae.  Given the presence of the ascites, accurate assessment for signs of acute colonic diverticulitis is not possible on today's examination.  Numerous dilated perianal and perirectal veins, compatible with hemorrhoids.  Urinary bladder is unremarkable.  A penile prosthesis is noted with a reservoir in the lower left anatomic pelvis.  Musculoskeletal: Diffuse body wall edema There are no aggressive appearing lytic or blastic lesions noted in the visualized portions of the skeleton.  Compression fracture at L1 with approximately 50% loss of anterior vertebral body height has an appearance suggestive of old injury.  IMPRESSION: 1.  The appearance of the liver is highly concerning for an infiltrative neoplasm, and given and the findings of cirrhosis, is most concerning for a diffusely infiltrative hepatocellular carcinoma. 2.   Moderate - large volume of ascites. 3.  Cholelithiasis.  Accurate assessment for acute cholecystitis is not possible on this examination given the adjacent ascites. 4.  Colonic diverticulosis.  Accurate assessment for acute diverticulitis is not possible on this examination given the presence of the ascites. 5. Atherosclerosis, including left main and three-vessel coronary artery disease. 6.  Additional incidental findings, as above.  Original Report Authenticated By: Florencia Reasons, M.D.   Dg Abd 2 Views  02/16/2012  *RADIOLOGY REPORT*  Clinical Data: Abdominal bloating, history of lap band  ABDOMEN - 2 VIEW  Comparison: Lumbar spine films of 01/18/2011  Findings: Although the orientation of the lap band is rather vertical, this does not appear to have changed when compared to the prior lumbar spine view from July 2012.  No free air is seen on the erect view.  However, on the supine views there is dilatation of a loop of small bowel in the left abdomen up to 4 cm.  This is worrisome for developing partial small bowel obstruction.  If further assessment is warranted CT of the abdomen pelvis with IV contrast media is recommended.  No colonic distention is seen.  The bones are osteopenic and there are diffuse degenerative changes throughout the lumbar spine.  IMPRESSION:  1. Possible developing partial small bowel obstruction.  Consider CT abdomen and  pelvis with IV contrast to assess further. 2.  No free air. 3.  No change in somewhat vertical orientation of the lap band.  Original Report Authenticated By: Juline Patch, M.D.     ASSESSMENT:   Active Problems:  UTI (lower urinary tract infection)  Liver tumor  Vomiting  Hyponatremia  Cholelithiasis without obstruction     PLAN:   New diagnosis chronic liver disease with suggestion of an infiltrating process, r/o HCC, r/o metastatic disease, r/o cholangiocarcinoma, no evidence of extrahepatic biliary obstruction. Synthetic function of the liver  reasonably preserved as per normal INR and nl ammonia, , low albumin 2.5, normal extrahepatic anatomy Suggest: await AFP,CEA results, he will most likely require sono guided liver biopsy Check ferritin, hepatitis serologies May need EGD to explain N&V and to r/o varices paracenthesis for cells, cytology, gram stain, culture     LOS: 1 day   Lina Sar  02/17/2012, 10:15 PM

## 2012-02-17 NOTE — H&P (Addendum)
PCP:   [name], MD   Chief Complaint:  Vomiting, abdominal pain  HPI: This is a 78 year old Caucasian male with a history of laparoscopic gastric banding, history of bladder cancer, diabetic neuropathy who presents to emergency department from [facility] due to abdominal distention and diffuse pain for evaluation for a small bowel obstruction. The patient's pain is started approximately 2 months ago but worse over the past 2 weeks. The patient has been having emesis with oral intake. 2 weeks ago he was evaluated and had a small amount of fluid from his gastric band removed. Since that time, the patient has had worsening symptoms of vomiting and total distention. He admits to subjective fevers and chills. She does not particularly feel nauseated but gags with by mouth intake. He also admits to urgency and frequency, as well as bladder pressure, but denies dysuria.   Review of Systems:  Tenses review of systems was performed which was as in the history of present illness or otherwise negative   Past Medical History: Past Medical History  Diagnosis Date  . CVA (cerebral infarction)     memory deficit  . Hx of laparoscopic gastric banding     morbid obesity  . Diabetes mellitus   . Insomnia   . Bladder cancer     inflatable penile implant  . Allergic rhinitis   . Hyperlipidemia   . Peripheral neuropathy   . Depression   . Osteoarthritis   . Acid reflux   . Syncope    Past Surgical History  Procedure Date  . Lap band 2005  . Penile prosthesis implant     due to bladder cancer  . Bladder surgery     for bladder cancer    Medications: Prior to Admission medications   Medication Sig Start Date End Date Taking? Authorizing Provider  esomeprazole (NEXIUM) 40 MG capsule Take 40 mg by mouth daily before breakfast.     Yes Historical Provider, MD  fluticasone (FLONASE) 50 MCG/ACT nasal spray Place 1 spray into the nose daily.  01/04/12  Yes Historical  Provider, MD  Folic Acid-Vit B6-Vit B12 (FOLBEE) 2.5-25-1 MG TABS Take 1 tablet by mouth daily.     Yes Historical Provider, MD  gabapentin (NEURONTIN) 600 MG tablet Take 600 mg by mouth 3 (three) times daily.     Yes Historical Provider, MD  meloxicam (MOBIC) 7.5 MG tablet Take 7.5 mg by mouth 2 (two) times daily.     Yes Historical Provider, MD  Multiple Vitamins-Minerals (CENTRUM SILVER PO) Take 1 tablet by mouth daily.    Yes Historical Provider, MD  oxyCODONE-acetaminophen (PERCOCET) 10-325 MG per tablet Take 1 tablet by mouth every 4 (four) hours as needed. pain   Yes Historical Provider, MD  Probiotic Product (SOLUBLE FIBER/PROBIOTICS PO) Take 1 tablet by mouth daily.    Yes Historical Provider, MD  Tamsulosin HCl (FLOMAX) 0.4 MG CAPS Take 0.4 mg by mouth daily.  12/08/11  Yes Historical Provider, MD  traMADol (ULTRAM) 50 MG tablet Take 50 mg by mouth every 6 (six) hours as needed. Pain   Yes Historical Provider, MD  VYTORIN 10-20 MG per tablet Take 1 tablet by mouth daily.  01/05/12  Yes Historical Provider, MD  zolpidem (AMBIEN) 10 MG tablet Take 10 mg by mouth at bedtime as needed. sleep   Yes Historical Provider, MD    Allergies:  No Known Allergies  Social History:  reports that he has been smoking.  He does not have any  smokeless tobacco history on file. He reports that he drinks alcohol. He reports that he does not use illicit drugs.  Family History: Family History  Problem Relation Age of Onset  . Hypertension    . Diabetes    . Cancer Sister 60    deceased  . COPD Mother 46    deceased  . Other Mother      breathing problems - emphzma  . Liver disease Sister 50    deceased    Physical Exam: Filed Vitals:   02/16/12 1713 02/17/12 0036  BP: 129/75 154/89  Pulse: 105 77  Temp: 99.1 F (37.3 C) 98.3 F (36.8 C)  TempSrc: Oral Oral  Resp: 16 18  Height: 5\' 11"  (1.803 m)   Weight: 98.431 kg (217 lb)   SpO2: 96% 95%   General appearance: alert, cooperative and  no distress Head: Normocephalic, without obvious abnormality, atraumatic Eyes: conjunctivae/corneas clear. PERRL, EOM's intact. Fundi benign. Ears: normal TM's and external ear canals both ears Throat: lips, mucosa, and tongue normal; teeth and gums normal Resp: clear to auscultation bilaterally Cardio: regular rate and rhythm, S1, S2 normal, no murmur, click, rub or gallop GI: Soft, mildly obese, tenderness in the right upper quadrant around liver margin. Tenderness is suprapubic area. No guarding or rebound tenderness. Extremities: extremities normal, atraumatic, no cyanosis or edema Pulses: 2+ and symmetric Skin: Skin color, texture, turgor normal. No rashes or lesions Lymph nodes: Cervical, supraclavicular, and axillary nodes normal. Neurologic: Alert and oriented X 3, normal strength and tone. Normal symmetric reflexes. Normal coordination and gait   Labs on Admission:   [facility] 02/16/12 1953  NA 133*  K 4.0  CL 98  CO2 25  GLUCOSE 70  BUN 20  CREATININE 0.98  CALCIUM 9.1  MG --  PHOS --    Basename 02/16/12 1953  AST 180*  ALT 80*  ALKPHOS 345*  BILITOT 1.9*  PROT 7.1  ALBUMIN 2.9*    Basename 02/16/12 1953  LIPASE 89*  AMYLASE --    Basename 02/16/12 1953  WBC 8.1  NEUTROABS 5.4  HGB 15.0  HCT 43.1  MCV 103.4*  PLT 128*   No results found for this basename: CKTOTAL:3,CKMB:3,CKMBINDEX:3,TROPONINI:3 in the last 72 hours No results found for this basename: TSH,T4TOTAL,FREET3,T3FREE,THYROIDAB in the last 72 hours No results found for this basename: VITAMINB12:2,FOLATE:2,FERRITIN:2,TIBC:2,IRON:2,RETICCTPCT:2 in the last 72 hours  Radiological Exams on Admission: US Abdomen Complete  02/17/2012  *RADIOLOGY REPORT*  Clinical Data:  Abdominal pain  ABDOMINAL ULTRASOUND COMPLETE  Comparison:  02/2012 CT  Findings:  Gallbladder:  Cholelithiasis noted.  Dominant echogenic shadowing gallstone measures 4.5 cm.  Normal wall thickness measuring 2.3 mm. No Murphy's  sign.  No evidence of cholecystitis.  Common Bile Duct:  Within normal limits in caliber.  Liver: Diffusely heterogeneous and echogenic anterior right liver and left hepatic lobe, concerning for underlying infiltrative tumor process suchas HCC when correlating with the CT findings. Perihepatic ascites noted.  No biliary dilatation.  IVC:  Appears normal.  Pancreas:  No abnormality identified.  Spleen:  Within normal limits in size and echotexture.  Right kidney:  Normal in size and parenchymal echogenicity.  No evidence of mass or hydronephrosis.  Left kidney:  Normal in size and parenchymal echogenicity.  Upper pole lateral hypoechoic cyst is noted with increased through transmission measuring 2 cm.  Abdominal Aorta:  Atherosclerotic changes.  Negative for aneurysm.  IMPRESSION: Abnormal heterogeneous liver with increased echogenicity throughout the anterior right lobe and the left  lobe concerning for an infiltrative tumor process when compared to the CT.  Abdominal ascites  Cholelithiasis but no evidence of wall thickening or Murphy's sign to suggest acute cholecystitis  No biliary dilatation  Left renal cyst  Original Report Authenticated By: Judie Petit. Ruel Favors, M.D.   Ct Abdomen Pelvis W Contrast  02/16/2012  *RADIOLOGY REPORT*  Clinical Data: Abdominal distension and pain.  CT ABDOMEN AND PELVIS WITH CONTRAST  Technique:  Multidetector CT imaging of the abdomen and pelvis was performed following the standard protocol during bolus administration of intravenous contrast.  Contrast: OMNIPAQUE IOHEXOL 300 MG/ML  SOLN  Comparison: No priors.  Findings:  Lung Bases: Scarring or subsegmental atelectasis in the left lower lobe. There is atherosclerosis of the thoracic aorta and the coronary arteries, including calcified atherosclerotic plaque in the the left main, left anterior descending, left circumflex and right coronary arteries.  Abdomen/Pelvis:  There is a diffusely infiltrative mass in the liver involving  segments II, III, IVa/IVb, V and VIII. This mass is heterogeneous in appearance with multifocal areas of low attenuation and heterogeneous enhancement.  The liver generally has a slightly shrunken and nodular contour in the area of the mass, but there appears to be some mild hypertrophy of the right lobe and definite hypertrophy of the caudate lobe.  Numerous gallstones are noted dependently within the lumen of the gallbladder.  The gallbladder wall appears mildly thickened, and there is a small amount of pericholecystic fluid, however, the overall appearance is not strongly suggestive of acute cholecystitis (findings are compounded by the presence of the surrounding ascites).  The appearance of the pancreas, spleen and bilateral adrenal glands is unremarkable.  Mild parenchymal thinning is noted at multiple regions in the kidneys bilaterally, which likely reflects some scarring.  2.2 cm exophytic cyst in the interpolar region of the left kidney.  There is a densely calcified lesion extending off the upper pole of the left kidney.  A nonobstructive 6 mm calculus is noted in the interpolar collecting system of the left kidney.  Moderate - large volume of ascites. Extensive atherosclerosis throughout the abdominal and pelvic vasculature, without definite aneurysm or dissection.  Status post lap band procedure.  Numerous colonic diverticulae.  Given the presence of the ascites, accurate assessment for signs of acute colonic diverticulitis is not possible on today's examination.  Numerous dilated perianal and perirectal veins, compatible with hemorrhoids.  Urinary bladder is unremarkable.  A penile prosthesis is noted with a reservoir in the lower left anatomic pelvis.  Musculoskeletal: Diffuse body wall edema There are no aggressive appearing lytic or blastic lesions noted in the visualized portions of the skeleton.  Compression fracture at L1 with approximately 50% loss of anterior vertebral body height has an  appearance suggestive of old injury.  IMPRESSION: 1.  The appearance of the liver is highly concerning for an infiltrative neoplasm, and given and the findings of cirrhosis, is most concerning for a diffusely infiltrative hepatocellular carcinoma. 2.  Moderate - large volume of ascites. 3.  Cholelithiasis.  Accurate assessment for acute cholecystitis is not possible on this examination given the adjacent ascites. 4.  Colonic diverticulosis.  Accurate assessment for acute diverticulitis is not possible on this examination given the presence of the ascites. 5. Atherosclerosis, including left main and three-vessel coronary artery disease. 6.  Additional incidental findings, as above.  Original Report Authenticated By: Florencia Reasons, M.D.   Dg Abd 2 Views  02/16/2012  *RADIOLOGY REPORT*  Clinical Data: Abdominal bloating, history  of lap band  ABDOMEN - 2 VIEW  Comparison: Lumbar spine films of 01/18/2011  Findings: Although the orientation of the lap band is rather vertical, this does not appear to have changed when compared to the prior lumbar spine view from July 2012.  No free air is seen on the erect view.  However, on the supine views there is dilatation of a loop of small bowel in the left abdomen up to 4 cm.  This is worrisome for developing partial small bowel obstruction.  If further assessment is warranted CT of the abdomen pelvis with IV contrast media is recommended.  No colonic distention is seen.  The bones are osteopenic and there are diffuse degenerative changes throughout the lumbar spine.  IMPRESSION:  1. Possible developing partial small bowel obstruction.  Consider CT abdomen and pelvis with IV contrast to assess further. 2.  No free air. 3.  No change in somewhat vertical orientation of the lap band.  Original Report Authenticated By: Juline Patch, M.D.    Assessment/Plan Present on Admission:  .UTI (lower urinary tract infection) .Liver tumor .Vomiting .Hyponatremia .Cholelithiasis  without obstruction  #1 urinary tract infection: Admit to medical floor.  Continue empiric antibiotics of ceftriaxone. Urine culture was sent prior to starting antibiotics. Continue IV fluid hydration. Will follow antibiotic cultures and change antibiotic therapy with sensitivity results.  #2 vomiting: I question whether this is related to #1 or related to the patient's gastric banding. Either way, continue to treat #1 and watch for improvement. Antiemetics as needed.  #3 hyponatremia, mild: Normal saline overnight and repeat BMP in the morning.  #4 cholelithiasis: No obstruction apparent. Common bile duct normal diameter.  Surgery was consulted by the emergency room. They will see the patient in the morning.  #5 infiltrative liver tumor: Suspicious of hepatobiliary carcinoma. Will obtain CT of the head and chest to evaluate for any metastatic disease. The patient does have an implanted loop recorder.  Consult oncology in AM.  #6 DVT prophylaxis: Lovenox  #7 CODE STATUS: Full code  60 minutes was spent in the admission of this patient  Candelaria Celeste JEHIEL 02/17/2012, 2:31 AM

## 2012-02-18 ENCOUNTER — Inpatient Hospital Stay (HOSPITAL_COMMUNITY): Payer: Medicare Other

## 2012-02-18 DIAGNOSIS — M129 Arthropathy, unspecified: Secondary | ICD-10-CM

## 2012-02-18 DIAGNOSIS — E871 Hypo-osmolality and hyponatremia: Secondary | ICD-10-CM

## 2012-02-18 LAB — AFP TUMOR MARKER: AFP-Tumor Marker: 2.2 ng/mL (ref 0.0–8.0)

## 2012-02-18 LAB — CEA: CEA: 0.7 ng/mL (ref 0.0–5.0)

## 2012-02-18 LAB — LACTATE DEHYDROGENASE, PLEURAL OR PERITONEAL FLUID: LD, Fluid: 41 U/L — ABNORMAL HIGH (ref 3–23)

## 2012-02-18 LAB — BODY FLUID CELL COUNT WITH DIFFERENTIAL: Neutrophil Count, Fluid: 0 % (ref 0–25)

## 2012-02-18 LAB — PROTEIN, BODY FLUID: Total protein, fluid: 0.8 g/dL

## 2012-02-18 MED ORDER — SODIUM CHLORIDE 0.9 % IV SOLN
INTRAVENOUS | Status: DC
  Administered 2012-02-19: 02:00:00 via INTRAVENOUS

## 2012-02-18 MED ORDER — FUROSEMIDE 80 MG PO TABS
80.0000 mg | ORAL_TABLET | Freq: Every day | ORAL | Status: DC
  Administered 2012-02-19 – 2012-02-22 (×4): 80 mg via ORAL
  Filled 2012-02-18 (×7): qty 1

## 2012-02-18 MED ORDER — ALBUMIN HUMAN 25 % IV SOLN
25.0000 g | Freq: Once | INTRAVENOUS | Status: AC
  Administered 2012-02-18: 25 g via INTRAVENOUS
  Filled 2012-02-18: qty 100

## 2012-02-18 MED ORDER — SODIUM CHLORIDE 0.9 % IV SOLN
INTRAVENOUS | Status: AC
  Administered 2012-02-18: 16:00:00 via INTRAVENOUS

## 2012-02-18 NOTE — Procedures (Signed)
US guided diagnostic/therapeutic paracentesis performed yielding 2.8 liters yellow fluid. A portion of the fluid was sent to the lab for preordered studies. No immediate complications.

## 2012-02-18 NOTE — Progress Notes (Addendum)
PROGRESS NOTE  Lance Best WUJ:[id number] DOB: [birth year] DOA: 02/16/2012 PCP: [name], MD  Brief narrative: 78 yr old male admit with N/V ascites and ? HCC  Past medical history-As per Problem list Chart reviewed as below- H/o syncopy followed by Dr. [name] 12/30/10 c Loop recorder in situ-not a candidate for pacmaker  Consultants:  Gi-Dr. [name]  Procedures: Abdominal x-ray 02/16/2012 = possible developing partial small bowel obstruction, consider CT abdomen pelvis with IV contrast  CT abdomen pelvis 02/16/2012 = liver highly concerning for infiltrative neoplasm and given findings of cirrhosis most concerning for diffusely infiltrative hepatocellular carcinoma, moderate to large volume ascites, cholelithiasis, colonic diverticula  Ultrasound abdomen 89 = abnormal heterogeneous liver with increased echogenicity to anterior right lobe the left lobe concerning for infiltrative tumor process when compared to CT  CT chest 89 = no findings suggest metastatic disease to thorax  CT head 8/9 = advanced chronic small vessel disease with old infarcts and right frontal and parietal lobes  Ultrasound paracentesis 8/10 = 2.8 L via the fluid removed with samples sent to lab for analysis  Antibiotics:  Rocephin 02/16/12-02/17/12  Urine culture no growth   Subjective  Doing fair.  Felt a little dizzy after paracentesis.  No nausea no vomiting no chest pain or dark stool No abdominal pain no blurred or double vision   Objective    Interim History: Appreciate gastroenterology input  Telemetry: Non-telemetry   Objective: Filed Vitals:   02/18/12 1106 02/18/12 1110 02/18/12 1129 02/18/12 1149  BP: 151/90 153/82 158/88 143/74  Pulse:    78  Temp:    98.1 F (36.7 C)  TempSrc:    Oral  Resp:    16  Height:      Weight:      SpO2: 97% 97% 95% 98%    Intake/Output Summary (Last 24 hours) at 02/18/12 1540 Last data filed at 02/18/12 1300  Gross per 24 hour  Intake     400 ml  Output    750 ml  Net   -350 ml    Exam:  HEENT-alert, oriented, temporal wasting  CTa b, no added sound  CARDIAC-s1 s2 no added sound mno m/r/g  ABDOMEN-distended tympanitic, decreased flank fullness  NEURO-NO asterixis, no clonus  SKIN/MUSCULAR-No splotches  Data Reviewed: Basic Metabolic Panel:  Lab 02/17/12 9562 02/16/12 1953  NA 136 133*  K 3.9 4.0  CL 101 98  CO2 27 25  GLUCOSE 88 70  BUN 17 20  CREATININE 1.03 0.98  CALCIUM 8.8 9.1  MG -- --  PHOS -- --   Liver Function Tests:  Lab 02/17/12 1744 02/17/12 0440 02/16/12 1953  AST 160* 150* 180*  ALT 76* 73* 80*  ALKPHOS 341* 320* 345*  BILITOT 1.9* 2.2* 1.9*  PROT 6.4 6.3 7.1  ALBUMIN 2.5* 2.5* 2.9*    Lab 02/16/12 1953  LIPASE 89*  AMYLASE --    Lab 02/17/12 1808  AMMONIA 55   CBC:  Lab 02/17/12 0440 02/16/12 1953  WBC 6.7 8.1  NEUTROABS -- 5.4  HGB 13.7 15.0  HCT 39.8 43.1  MCV 103.4* 103.4*  PLT 112* 128*   Cardiac Enzymes: No results found for this basename: CKTOTAL:5,CKMB:5,CKMBINDEX:5,TROPONINI:5 in the last 168 hours BNP: No components found with this basename: POCBNP:5 CBG: No results found for this basename: GLUCAP:5 in the last 168 hours  Recent Results (from the past 240 hour(s))  URINE CULTURE     Status: Normal   Collection Time   02/16/12  7:27 PM      Component Value Range Status Comment   Specimen Description URINE, RANDOM   Final    Special Requests NONE   Final    Culture  Setup Time 02/17/2012 03:42   Final    Colony Count NO GROWTH   Final    Culture NO GROWTH   Final    Report Status 02/17/2012 FINAL   Final      Studies:              All Imaging reviewed and is as per above notation   Scheduled Meds:   . albumin human  25 g Intravenous Once  . cefTRIAXone (ROCEPHIN)  IV  1 g Intravenous QHS  . enoxaparin (LOVENOX) injection  40 mg Subcutaneous Q24H  . ezetimibe-simvastatin  1 tablet Oral Daily  . fluticasone  1 spray Each Nare Daily  . furosemide   80 mg Oral Daily  . gabapentin  600 mg Oral TID  . pantoprazole  80 mg Oral Q1200  . spironolactone  100 mg Oral Daily  . Tamsulosin HCl  0.4 mg Oral Daily  . DISCONTD: furosemide  40 mg Oral Daily  . DISCONTD: meloxicam  7.5 mg Oral BID   Continuous Infusions:   . DISCONTD: 0.9 % NaCl with KCl 20 mEq / L 125 mL/hr at 02/17/12 1359     Assessment/Plan: 1. ?  Cholangiocarcinoma-per gastroenterology-appreciate input.  AFB = 2.2, HIV negative, CEA = 0.7, CA 19-9 = 13.2 other labs etc. still pending. 2. Ascites secondary to #1-replace albumin given paracentesis done today 8/10.  IV fluids 75 cc an hour.  Note gastroenterologist has titrated diuretics to spironolactone 100 by mouth daily, Lasix 80 mg daily 3. History gastric banding in [place]-all meals should be small, nutritionist to help with counseling regarding this 4. Hyponatremia-resolved with IV fluid repletion 5. altered LFTs without encephalopathy-secondary to #1, ammonia 55 6. Coincidental cholelithiasis-await surgeon's input, gastroenterology is coordinating CT/sono guided liver biopsy 7. History syncope followed by electrophysiology-has loop recorder in place, no further symptoms other than mild dizziness, potentially related to mild hypovolemia from fluid tap 8. Nausea vomiting-resolved, per gastroenterology regarding endoscopy-potentially related to gastric banding procedure 9. History of bladder cancer-obtain records from urologist if pissble 10. History remote CVA 11. History diabetes mellitus-blood sugar is low.  Hold any insulin for now 12. History hyperlipidemia-hold statin for now 13. History of peripheral neuropathy-stable  Code Status: Full code Family Communication: Discussed my role in patient's care-I will manage peripheral medical problems but be guided by gastroenterology regarding workup and disposition.  Appreciate input Disposition Plan: Inpatient   Pleas Koch, MD  [facility] Pager  [phone] 02/18/2012, 3:40 PM    LOS: 2 days

## 2012-02-18 NOTE — Progress Notes (Signed)
Subjective Scrotum ans penis swelling, no other complaints  Objective: for paracentesis today,  Blood work pending Vital signs in last 24 hours: Temp:  [97.4 F (36.3 C)-98.8 F (37.1 C)] 97.4 F (36.3 C) (08/10 0500) Pulse Rate:  [80-87] 80  (08/10 0500) Resp:  [16-18] 16  (08/10 0500) BP: (123-144)/(73-94) 126/74 mmHg (08/10 0500) SpO2:  [92 %-95 %] 92 % (08/10 0500) Last BM Date: 02/16/12 General:   Alert,  pleasant, cooperative in NAD Head:  Normocephalic and atraumatic. Eyes:  Sclera clear, no icterus.   Conjunctiva pink. Mouth:  No deformity or lesions, dentition normal. Neck:  Supple; no masses or thyromegaly. Heart:  Regular rate and rhythm; no murmurs, clicks, rubs,  or gallops. Lungs:  No wheezes or rales Abdomen:  Ascites, non tender, normal bowl sounds  Msk:  Symmetrical without gross deformities. Normal posture. Pulses:  Normal pulses noted. Extremities:  Without clubbing or edema. Neurologic:  Alert and  oriented x4;  grossly normal neurologically., no asterixis Skin:  Intact without significant lesions or rashes.  Intake/Output from previous day: 08/09 0701 - 08/10 0700 In: 785 [P.O.:260; I.V.:525] Out: 0  Intake/Output this shift: Total I/O In: 240 [P.O.:240] Out: -   Lab Results:  Basename 02/17/12 0440 02/16/12 1953  WBC 6.7 8.1  HGB 13.7 15.0  HCT 39.8 43.1  PLT 112* 128*   BMET  Basename 02/17/12 0440 02/16/12 1953  NA 136 133*  K 3.9 4.0  CL 101 98  CO2 27 25  GLUCOSE 88 70  BUN 17 20  CREATININE 1.03 0.98  CALCIUM 8.8 9.1   LFT  Basename 02/17/12 1744  PROT 6.4  ALBUMIN 2.5*  AST 160*  ALT 76*  ALKPHOS 341*  BILITOT 1.9*  BILIDIR 1.0*  IBILI 0.9   PT/INR  Basename 02/17/12 1933  LABPROT 13.9  INR 1.05   Hepatitis Panel No results found for this basename: HEPBSAG,HCVAB,HEPAIGM,HEPBIGM in the last 72 hours  Studies/Results: Ct Head W Wo Contrast  02/17/2012  *RADIOLOGY REPORT*  Clinical Data: New diagnosis liver  cancer.  CT HEAD WITHOUT AND WITH CONTRAST  Technique:  Contiguous axial images were obtained from the base of the skull through the vertex without and with intravenous contrast.  Contrast: OMNIPAQUE IOHEXOL 300 MG/ML  SOLN  Comparison: 10/23/2010  Findings: Extensive low density throughout the periventricular and deep white matter, likely chronic ischemic changes, stable since prior study.  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  No enhancing lesions.  No acute infarction or hemorrhage.  No hydrocephalus.  No midline shift.  IMPRESSION: Advanced chronic small vessel disease.  Mild atrophy.  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  No acute findings.  Original Report Authenticated By: Cyndie Chime, M.D.   Ct Chest W Contrast  02/17/2012  *RADIOLOGY REPORT*  Clinical Data: Liver tumors.  Evaluate for metastatic disease.  CT CHEST WITH CONTRAST  Technique:  Multidetector CT imaging of the chest was performed following the standard protocol during bolus administration of intravenous contrast.  Contrast: OMNIPAQUE IOHEXOL 300 MG/ML  SOLN  Comparison: CT of the chest 10/23/2010.  Findings:  Mediastinum: Heart size is normal. There is no significant pericardial fluid, thickening or pericardial calcification. There is atherosclerosis of the thoracic aorta, the great vessels of the mediastinum and the coronary arteries, including calcified atherosclerotic plaque in the left main, left anterior descending, left circumflex and right coronary arteries. Postoperative changes of lap band procedure near the gastroesophageal junction. No pathologically enlarged  mediastinal or hilar lymph nodes.  Lungs/Pleura: Subsegmental atelectasis and/or scarring in the left lower lobe.  There are a few tiny subpleural nodules in the posterior aspect of the right lung, several which are calcified; these are tiny and nonspecific, favored to represent either granulomas or subpleural lymph nodes.  No other  larger more suspicious appearing pulmonary nodules or masses are otherwise identified to suggest presence of metastatic disease to the lungs on today's examination.  No acute consolidative airspace disease. Trace left pleural effusion.  Upper Abdomen: Please see dictation from CT scan of 02/16/2012 for full description of upper abdominal findings.  Musculoskeletal: There are no aggressive appearing lytic or blastic lesions noted in the visualized portions of the skeleton. Compression fracture of L1 with approximately 50% loss of anterior vertebral body height is unchanged.  IMPRESSION: 1.  No findings to suggest metastatic disease to the thorax on today's examination. 2. Atherosclerosis, including left main and three-vessel coronary artery disease. 3.  Subsegmental atelectasis and/or scarring in the left lower lobe. 4.  Additional incidental findings, as above.  Original Report Authenticated By: Florencia Reasons, M.D.   US Abdomen Complete  02/17/2012  *RADIOLOGY REPORT*  Clinical Data:  Abdominal pain  ABDOMINAL ULTRASOUND COMPLETE  Comparison:  02/2012 CT  Findings:  Gallbladder:  Cholelithiasis noted.  Dominant echogenic shadowing gallstone measures 4.5 cm.  Normal wall thickness measuring 2.3 mm. No Murphy's sign.  No evidence of cholecystitis.  Common Bile Duct:  Within normal limits in caliber.  Liver: Diffusely heterogeneous and echogenic anterior right liver and left hepatic lobe, concerning for underlying infiltrative tumor process suchas HCC when correlating with the CT findings. Perihepatic ascites noted.  No biliary dilatation.  IVC:  Appears normal.  Pancreas:  No abnormality identified.  Spleen:  Within normal limits in size and echotexture.  Right kidney:  Normal in size and parenchymal echogenicity.  No evidence of mass or hydronephrosis.  Left kidney:  Normal in size and parenchymal echogenicity.  Upper pole lateral hypoechoic cyst is noted with increased through transmission measuring 2 cm.   Abdominal Aorta:  Atherosclerotic changes.  Negative for aneurysm.  IMPRESSION: Abnormal heterogeneous liver with increased echogenicity throughout the anterior right lobe and the left lobe concerning for an infiltrative tumor process when compared to the CT.  Abdominal ascites  Cholelithiasis but no evidence of wall thickening or Murphy's sign to suggest acute cholecystitis  No biliary dilatation  Left renal cyst  Original Report Authenticated By: Judie Petit. Ruel Favors, M.D.   Ct Abdomen Pelvis W Contrast  02/16/2012  *RADIOLOGY REPORT*  Clinical Data: Abdominal distension and pain.  CT ABDOMEN AND PELVIS WITH CONTRAST  Technique:  Multidetector CT imaging of the abdomen and pelvis was performed following the standard protocol during bolus administration of intravenous contrast.  Contrast: OMNIPAQUE IOHEXOL 300 MG/ML  SOLN  Comparison: No priors.  Findings:  Lung Bases: Scarring or subsegmental atelectasis in the left lower lobe. There is atherosclerosis of the thoracic aorta and the coronary arteries, including calcified atherosclerotic plaque in the the left main, left anterior descending, left circumflex and right coronary arteries.  Abdomen/Pelvis:  There is a diffusely infiltrative mass in the liver involving segments II, III, IVa/IVb, V and VIII. This mass is heterogeneous in appearance with multifocal areas of low attenuation and heterogeneous enhancement.  The liver generally has a slightly shrunken and nodular contour in the area of the mass, but there appears to be some mild hypertrophy of the right lobe and definite hypertrophy  of the caudate lobe.  Numerous gallstones are noted dependently within the lumen of the gallbladder.  The gallbladder wall appears mildly thickened, and there is a small amount of pericholecystic fluid, however, the overall appearance is not strongly suggestive of acute cholecystitis (findings are compounded by the presence of the surrounding ascites).  The appearance of the  pancreas, spleen and bilateral adrenal glands is unremarkable.  Mild parenchymal thinning is noted at multiple regions in the kidneys bilaterally, which likely reflects some scarring.  2.2 cm exophytic cyst in the interpolar region of the left kidney.  There is a densely calcified lesion extending off the upper pole of the left kidney.  A nonobstructive 6 mm calculus is noted in the interpolar collecting system of the left kidney.  Moderate - large volume of ascites. Extensive atherosclerosis throughout the abdominal and pelvic vasculature, without definite aneurysm or dissection.  Status post lap band procedure.  Numerous colonic diverticulae.  Given the presence of the ascites, accurate assessment for signs of acute colonic diverticulitis is not possible on today's examination.  Numerous dilated perianal and perirectal veins, compatible with hemorrhoids.  Urinary bladder is unremarkable.  A penile prosthesis is noted with a reservoir in the lower left anatomic pelvis.  Musculoskeletal: Diffuse body wall edema There are no aggressive appearing lytic or blastic lesions noted in the visualized portions of the skeleton.  Compression fracture at L1 with approximately 50% loss of anterior vertebral body height has an appearance suggestive of old injury.  IMPRESSION: 1.  The appearance of the liver is highly concerning for an infiltrative neoplasm, and given and the findings of cirrhosis, is most concerning for a diffusely infiltrative hepatocellular carcinoma. 2.  Moderate - large volume of ascites. 3.  Cholelithiasis.  Accurate assessment for acute cholecystitis is not possible on this examination given the adjacent ascites. 4.  Colonic diverticulosis.  Accurate assessment for acute diverticulitis is not possible on this examination given the presence of the ascites. 5. Atherosclerosis, including left main and three-vessel coronary artery disease. 6.  Additional incidental findings, as above.  Original Report  Authenticated By: Florencia Reasons, M.D.   Dg Abd 2 Views  02/16/2012  *RADIOLOGY REPORT*  Clinical Data: Abdominal bloating, history of lap band  ABDOMEN - 2 VIEW  Comparison: Lumbar spine films of 01/18/2011  Findings: Although the orientation of the lap band is rather vertical, this does not appear to have changed when compared to the prior lumbar spine view from July 2012.  No free air is seen on the erect view.  However, on the supine views there is dilatation of a loop of small bowel in the left abdomen up to 4 cm.  This is worrisome for developing partial small bowel obstruction.  If further assessment is warranted CT of the abdomen pelvis with IV contrast media is recommended.  No colonic distention is seen.  The bones are osteopenic and there are diffuse degenerative changes throughout the lumbar spine.  IMPRESSION:  1. Possible developing partial small bowel obstruction.  Consider CT abdomen and pelvis with IV contrast to assess further. 2.  No free air. 3.  No change in somewhat vertical orientation of the lap band.  Original Report Authenticated By: Juline Patch, M.D.     ASSESSMENT:   Active Problems:  UTI (lower urinary tract infection)  Liver tumor  Vomiting  Hyponatremia  Cholelithiasis without obstruction  Nonspecific (abnormal) findings on radiological and other examination of biliary tract  Nonspecific elevation of levels of transaminase  or lactic acid dehydrogenase (LDH)     PLAN:   Ct scan is suggestive of cholangiocarcinoma, I have discussed the plan with IR , if paracenthesis not diagnostic, then he will have a CT or sono- guided liver biopsy  with large volume paracenthesis prior to the biopsy Increase diuretics Upper endoscopy tomorrow to look for varices and evaluate N&V     LOS: 2 days   Lina Sar  02/18/2012, 10:41 AM

## 2012-02-19 ENCOUNTER — Encounter (HOSPITAL_COMMUNITY): Payer: Self-pay | Admitting: *Deleted

## 2012-02-19 ENCOUNTER — Encounter (HOSPITAL_COMMUNITY)
Admission: EM | Disposition: A | Discharge status: Home Health | Payer: Self-pay | Source: Home / Self Care | Attending: Family Medicine

## 2012-02-19 DIAGNOSIS — Z9884 Bariatric surgery status: Secondary | ICD-10-CM

## 2012-02-19 DIAGNOSIS — I8500 Esophageal varices without bleeding: Secondary | ICD-10-CM | POA: Diagnosis present

## 2012-02-19 HISTORY — PX: ESOPHAGOGASTRODUODENOSCOPY: SHX5428

## 2012-02-19 SURGERY — EGD (ESOPHAGOGASTRODUODENOSCOPY)
Anesthesia: Moderate Sedation

## 2012-02-19 MED ORDER — FENTANYL CITRATE 0.05 MG/ML IJ SOLN
INTRAMUSCULAR | Status: DC | PRN
  Administered 2012-02-19: 25 ug via INTRAVENOUS

## 2012-02-19 MED ORDER — MIDAZOLAM HCL 10 MG/2ML IJ SOLN
INTRAMUSCULAR | Status: DC | PRN
  Administered 2012-02-19: 1 mg via INTRAVENOUS
  Administered 2012-02-19: 2 mg via INTRAVENOUS

## 2012-02-19 MED ORDER — BUTAMBEN-TETRACAINE-BENZOCAINE 2-2-14 % EX AERO
INHALATION_SPRAY | CUTANEOUS | Status: DC | PRN
  Administered 2012-02-19: 2 via TOPICAL

## 2012-02-19 NOTE — Progress Notes (Signed)
PROGRESS NOTE  Lance Best HYQ:[id number] DOB: 1933-11-01 DOA: 02/16/2012 PCP: [name], MD  Brief narrative: 78 yr old male admit with N/V ascites and ? HCC  Past medical history-As per Problem list Chart reviewed as below- H/o syncopy followed by Dr. [name] 12/30/10 c Loop recorder in situ-not a candidate for pacmaker  Consultants:  Gi-Dr. [name]  Procedures:  Abdominal x-ray 02/16/2012 = possible developing partial small bowel obstruction, consider CT abdomen pelvis with IV contrast  CT abdomen pelvis 02/16/2012 = liver highly concerning for infiltrative neoplasm and given findings of cirrhosis most concerning for diffusely infiltrative hepatocellular carcinoma, moderate to large volume ascites, cholelithiasis, colonic diverticula  Ultrasound abdomen 89 = abnormal heterogeneous liver with increased echogenicity to anterior right lobe the left lobe concerning for infiltrative tumor process when compared to CT  CT chest 89 = no findings suggest metastatic disease to thorax  CT head 8/9 = advanced chronic small vessel disease with old infarcts and right frontal and parietal lobes  Ultrasound paracentesis 8/10 = 2.8 L via the fluid removed with samples sent to lab for analysis  Antibiotics:  Rocephin 02/16/12-02/19/12  Urine culture no growth   Subjective  Doing fair.  Had a little pain after paracentesis, which is better Tolerated Upper Gi scope today without event. Eating fat restricted diet.  Family in room No cp/n/v/sob/ha   Objective    Interim History: Appreciate gastroenterology input  Telemetry: Non-telemetry   Objective: Filed Vitals:   02/19/12 0920 02/19/12 0930 02/19/12 0940 02/19/12 1002  BP: 125/61 120/62 119/67 127/80  Pulse:      Temp:    98.2 F (36.8 C)  TempSrc:      Resp: 17 16 19 16   Height:      Weight:      SpO2: 96% 94% 93% 93%    Intake/Output Summary (Last 24 hours) at 02/19/12 1451 Last data filed at 02/19/12 0836  Gross per 24 hour  Intake  790.5 ml  Output   1800 ml  Net -1009.5 ml    Exam:  HEENT-alert, oriented, temporal wasting  CTa b, no added sound  CARDIAC-s1 s2 no added sound mno m/r/g  ABDOMEN-distended tympanitic, decreased flank fullness  NEURO-NO asterixis, no clonus  SKIN/MUSCULAR-No splotches  Data Reviewed: Basic Metabolic Panel:  Lab 02/17/12 9528 02/16/12 1953  NA 136 133*  K 3.9 4.0  CL 101 98  CO2 27 25  GLUCOSE 88 70  BUN 17 20  CREATININE 1.03 0.98  CALCIUM 8.8 9.1  MG -- --  PHOS -- --   Liver Function Tests:  Lab 02/17/12 1744 02/17/12 0440 02/16/12 1953  AST 160* 150* 180*  ALT 76* 73* 80*  ALKPHOS 341* 320* 345*  BILITOT 1.9* 2.2* 1.9*  PROT 6.4 6.3 7.1  ALBUMIN 2.5* 2.5* 2.9*    Lab 02/16/12 1953  LIPASE 89*  AMYLASE --    Lab 02/17/12 1808  AMMONIA 55   CBC:  Lab 02/17/12 0440 02/16/12 1953  WBC 6.7 8.1  NEUTROABS -- 5.4  HGB 13.7 15.0  HCT 39.8 43.1  MCV 103.4* 103.4*  PLT 112* 128*   Cardiac Enzymes: No results found for this basename: CKTOTAL:5,CKMB:5,CKMBINDEX:5,TROPONINI:5 in the last 168 hours BNP: No components found with this basename: POCBNP:5 CBG: No results found for this basename: GLUCAP:5 in the last 168 hours  Recent Results (from the past 240 hour(s))  URINE CULTURE     Status: Normal   Collection Time   02/16/12  7:27 PM  Component Value Range Status Comment   Specimen Description URINE, RANDOM   Final    Special Requests NONE   Final    Culture  Setup Time 02/17/2012 03:42   Final    Colony Count NO GROWTH   Final    Culture NO GROWTH   Final    Report Status 02/17/2012 FINAL   Final   BODY FLUID CULTURE     Status: Normal (Preliminary result)   Collection Time   02/18/12 11:07 AM      Component Value Range Status Comment   Specimen Description ASCITIC   Final    Special Requests Normal   Final    Gram Stain PENDING   Incomplete    Culture NO GROWTH   Final    Report Status PENDING   Incomplete       Studies:              All Imaging reviewed and is as per above notation   Scheduled Meds:    . albumin human  25 g Intravenous Once  . cefTRIAXone (ROCEPHIN)  IV  1 g Intravenous QHS  . enoxaparin (LOVENOX) injection  40 mg Subcutaneous Q24H  . ezetimibe-simvastatin  1 tablet Oral Daily  . fluticasone  1 spray Each Nare Daily  . furosemide  80 mg Oral Daily  . gabapentin  600 mg Oral TID  . pantoprazole  80 mg Oral Q1200  . spironolactone  100 mg Oral Daily  . Tamsulosin HCl  0.4 mg Oral Daily   Continuous Infusions:    . sodium chloride 125 mL/hr at 02/18/12 1614  . DISCONTD: sodium chloride 20 mL/hr at 02/19/12 0216     Assessment/Plan: 1. ?  Cholangiocarcinoma-per gastroenterology-appreciate input.  AFB = 2.2, HIV negative, CEA = 0.7, CA 19-9 = 13.2 other labs etc. still pending.  Per my discussion c Dr. [name] 8/11, Plan would be for Paracentesis + IR guided Biopsy liver 8/12.  Family/Patient aware.  Rpt Cmet/Inr in am 2. Ascites secondary to #1-continue diuretics to spironolactone 100 by mouth daily, Lasix 80 mg daily.  Ascites tapped 8/10.  Will need rpt Tap c Biospy 8/11 3. History gastric banding in [place]-all meals should be small, nutritionist to help with counseling regarding this 4. NO UTIRocephin d/c 8/11. 5. Hyponatremia-resolved with IV fluid repletion 6. altered LFTs without encephalopathy-secondary to #1, ammonia 55 7. Coincidental cholelithiasis-surgeon consulted on admit-Will involve them formally if prn. Gastroenterology is coordinating CT/sono guided liver biopsy 8. History syncope followed by electrophysiology-has loop recorder in place, no further symptoms other than mild dizziness, potentially related to mild hypovolemia from fluid tap 9. Nausea vomiting-resolved. 10. History of bladder cancer-OP F/u.  Will obtain records.  Requested permissions from Patient/family. Secretary to obtain please 11. History remote CVA 12. History diabetes mellitus-blood  sugar is low.  Hold any insulin for now.  Check am fasting glucose 8/11 13. History hyperlipidemia-hold statin for now 14. History of peripheral neuropathy-stable  Code Status: Full code Family Communication: Discussed my role in patient's care-I will manage peripheral medical problems but will be guided by gastroenterology regarding workup and disposition.  Appreciate input. Disposition Plan: Inpatient   Pleas Koch, MD  [facility] Pager [phone] 02/19/2012, 2:51 PM    LOS: 3 days

## 2012-02-19 NOTE — Op Note (Signed)
[facility] [address] [place], [place]  [address]  ENDOSCOPY PROCEDURE REPORT  PATIENT:  Lance Best, Lance Best  MR#:  [id number] BIRTHDATE:  1933/07/14, 77 yrs. old  GENDER:  male  ENDOSCOPIST:  Hedwig Morton. [name], MD Referred by:  [facility],  PROCEDURE DATE:  02/19/2012 PROCEDURE:  EGD with biopsy, 43239 ASA CLASS:  Class III INDICATIONS:  nausea and vomiting liver mass, r/o cirrhosis, r/o varices, hx of lap band 6 years ago  MEDICATIONS:   These medications were titrated to patient response per physician's verbal order, Versed 3 mg, Fentanyl 25 mcg TOPICAL ANESTHETIC:  Cetacaine Spray  DESCRIPTION OF PROCEDURE:   After the risks benefits and alternatives of the procedure were thoroughly explained, informed consent was obtained.  The Pentax Gastroscope [id number] endoscope was introduced through the mouth and advanced to the second portion of the duodenum, without limitations.  The instrument was slowly withdrawn as the mucosa was fully examined. 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  Portal Hypertensive gastropathy. large edematous folds, edema, bo bleeding With standard forceps, a biopsy was obtained and sent to pathology (see image6, image5, and image4).  Otherwise the examination was normal (see image3). band location not clearly visible, possibly at the g-e junction    Retroflexed views revealed no abnormalities.    The scope was then withdrawn from the patient and the procedure completed.  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  ______________________________ Hedwig Morton. [name], MD  CC:  n. eSIGNED:   Hedwig Morton. Yeiren Whitecotton at 02/19/2012 09:12 AM  Audelia Hives, [id number]

## 2012-02-19 NOTE — Interval H&P Note (Signed)
History and Physical Interval Note:  02/19/2012 8:51 AM  Lance Best  has presented today for surgery, with the diagnosis of N&V, r/o varices  The various methods of treatment have been discussed with the patient and family. After consideration of risks, benefits and other options for treatment, the patient has consented to  Procedure(s) (LRB): ESOPHAGOGASTRODUODENOSCOPY (EGD) (N/A) as a surgical intervention .  The patient's history has been reviewed, patient examined, no change in status, stable for surgery.  I have reviewed the patient's chart and labs.  Questions were answered to the patient's satisfaction.     Lina Sar

## 2012-02-20 ENCOUNTER — Encounter (HOSPITAL_COMMUNITY): Payer: Self-pay | Admitting: Internal Medicine

## 2012-02-20 DIAGNOSIS — G589 Mononeuropathy, unspecified: Secondary | ICD-10-CM

## 2012-02-20 DIAGNOSIS — K8020 Calculus of gallbladder without cholecystitis without obstruction: Secondary | ICD-10-CM

## 2012-02-20 LAB — HEPATITIS PANEL, ACUTE: Hep A IgM: NEGATIVE

## 2012-02-20 LAB — CBC
Hemoglobin: 14.4 g/dL (ref 13.0–17.0)
Platelets: 109 10*3/uL — ABNORMAL LOW (ref 150–400)
RBC: 4.03 MIL/uL — ABNORMAL LOW (ref 4.22–5.81)
WBC: 8.7 10*3/uL (ref 4.0–10.5)

## 2012-02-20 LAB — PROTIME-INR
INR: 1.07 (ref 0.00–1.49)
Prothrombin Time: 14.1 seconds (ref 11.6–15.2)

## 2012-02-20 LAB — COMPREHENSIVE METABOLIC PANEL
Albumin: 2.6 g/dL — ABNORMAL LOW (ref 3.5–5.2)
BUN: 17 mg/dL (ref 6–23)
Calcium: 9.2 mg/dL (ref 8.4–10.5)
Creatinine, Ser: 1.12 mg/dL (ref 0.50–1.35)
Potassium: 3.7 mEq/L (ref 3.5–5.1)
Total Protein: 6.3 g/dL (ref 6.0–8.3)

## 2012-02-20 MED ORDER — GUAIFENESIN 100 MG/5ML PO SOLN
200.0000 mg | ORAL | Status: DC | PRN
  Administered 2012-02-20 – 2012-02-23 (×3): 200 mg via ORAL
  Filled 2012-02-20 (×3): qty 10

## 2012-02-20 MED ORDER — SPIRONOLACTONE 100 MG PO TABS
100.0000 mg | ORAL_TABLET | Freq: Once | ORAL | Status: AC
  Administered 2012-02-20: 100 mg via ORAL
  Filled 2012-02-20: qty 1

## 2012-02-20 MED ORDER — GUAIFENESIN 100 MG/5ML PO SYRP
200.0000 mg | ORAL_SOLUTION | ORAL | Status: DC | PRN
  Filled 2012-02-20: qty 118

## 2012-02-20 MED ORDER — SPIRONOLACTONE 100 MG PO TABS
200.0000 mg | ORAL_TABLET | Freq: Every day | ORAL | Status: DC
  Administered 2012-02-21 – 2012-02-23 (×3): 200 mg via ORAL
  Filled 2012-02-20 (×4): qty 2

## 2012-02-20 NOTE — Progress Notes (Signed)
[facility]    I spoke with Dr. Fredia Sorrow (IR), liver lesion should be amenable to ultrasound guided liver biopsy. If there is adequate ascites to interfere with biopsy, IR will do a paracentesis beforehand.  Awaiting ascitic fluid cytology, if negative will proceed with percutaneous ultrasound guided biopsy.  Willette Cluster, NP-C

## 2012-02-20 NOTE — Progress Notes (Signed)
Patient seen, examined, and I agree with the above documentation, including the assessment and plan. Awaiting cytology results from paracentesis. Diagnostically this is low yield, particularly for the type of likely malignancy We've contacted radiology for biopsy of liver mass, and they will determine CT versus ultrasound-guided. He may need paracentesis prior to biopsy. If paracentesis is repeated and would also again send for cytology Given the varices and portal hypertensive gastropathy seen on EGD, he likely has underlying cirrhosis, and this is cryptogenic but the working diagnosis is NASH We'll increase spironolactone to 200 mg a day, and continue Lasix 80 mg daily Awaiting viral studies. Agree with beta blocker given large varices and portal hypertensive gastropathy, but will hold off on initiation of this therapy until after further diagnostic studies performed

## 2012-02-20 NOTE — Progress Notes (Signed)
Patient ID: Lance Best, male   DOB: Sep 22, 1933, 78 y.o.   MRN: [id number] Pt tent scheduled for US guided liver lesion biopsy with possible paracentesis on 8/13. Imaging studies have been reviewed by Dr. Fredia Sorrow. Hx as noted below. Exam:  Chest- sl diminished BS bases, heart-RRR, abd- dist, +BS.  Filed Vitals:   02/19/12 1400 02/19/12 2104 02/20/12 0407 02/20/12 1400  BP: 141/80 125/75 123/76 120/73  Pulse: 87 89 80 90  Temp: 98 F (36.7 C) 97.8 F (36.6 C) 97.2 F (36.2 C) 97.6 F (36.4 C)  TempSrc: Oral Oral Oral Oral  Resp: 16 20 18 20   Height:      Weight:   212 lb 4.9 oz (96.3 kg)   SpO2: 94% 93% 92% 91%   Past Medical History  Diagnosis Date  . CVA (cerebral infarction)     memory deficit  . Hx of laparoscopic gastric banding     morbid obesity  . Diabetes mellitus   . Insomnia   . Allergic rhinitis   . Hyperlipidemia   . Peripheral neuropathy   . Depression   . Osteoarthritis   . Acid reflux   . Syncope   . Stroke 1998    multiple   . Bladder cancer     inflatable penile implant   Past Surgical History  Procedure Date  . Lap band 2005  . Penile prosthesis implant     due to bladder cancer  . Bladder surgery     for bladder cancer  . Loop recorder implant     in heart to assess cause of syncope   . Esophagogastroduodenoscopy 02/19/2012    Procedure: ESOPHAGOGASTRODUODENOSCOPY (EGD);  Surgeon: Hart Carwin, MD;  Location: [facility];  Service: Endoscopy;  Laterality: N/A;   Ct Head W Wo Contrast  02/17/2012  *RADIOLOGY REPORT*  Clinical Data: New diagnosis liver cancer.  CT HEAD WITHOUT AND WITH CONTRAST  Technique:  Contiguous axial images were obtained from the base of the skull through the vertex without and with intravenous contrast.  Contrast: OMNIPAQUE IOHEXOL 300 MG/ML  SOLN  Comparison: 10/23/2010  Findings: Extensive low density throughout the periventricular and deep white matter, likely chronic ischemic changes, stable since prior study.  Old  right frontal and parietal infarcts, stable.  No enhancing lesions.  No acute infarction or hemorrhage.  No hydrocephalus.  No midline shift.  IMPRESSION: Advanced chronic small vessel disease.  Mild atrophy.  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  No acute findings.  Original Report Authenticated By: Cyndie Chime, M.D.   Ct Chest W Contrast  02/17/2012  *RADIOLOGY REPORT*  Clinical Data: Liver tumors.  Evaluate for metastatic disease.  CT CHEST WITH CONTRAST  Technique:  Multidetector CT imaging of the chest was performed following the standard protocol during bolus administration of intravenous contrast.  Contrast: OMNIPAQUE IOHEXOL 300 MG/ML  SOLN  Comparison: CT of the chest 10/23/2010.  Findings:  Mediastinum: Heart size is normal. There is no significant pericardial fluid, thickening or pericardial calcification. There is atherosclerosis of the thoracic aorta, the great vessels of the mediastinum and the coronary arteries, including calcified atherosclerotic plaque in the left main, left anterior descending, left circumflex and right coronary arteries. Postoperative changes of lap band procedure near the gastroesophageal junction. No pathologically enlarged mediastinal or hilar lymph nodes.  Lungs/Pleura: Subsegmental atelectasis and/or scarring in the left lower lobe.  There are a few tiny subpleural nodules in the posterior aspect of the  right lung, several which are calcified; these are tiny and nonspecific, favored to represent either granulomas or subpleural lymph nodes.  No other larger more suspicious appearing pulmonary nodules or masses are otherwise identified to suggest presence of metastatic disease to the lungs on today's examination.  No acute consolidative airspace disease. Trace left pleural effusion.  Upper Abdomen: Please see dictation from CT scan of 02/16/2012 for full description of upper abdominal findings.  Musculoskeletal: There are no aggressive appearing lytic  or blastic lesions noted in the visualized portions of the skeleton. Compression fracture of L1 with approximately 50% loss of anterior vertebral body height is unchanged.  IMPRESSION: 1.  No findings to suggest metastatic disease to the thorax on today's examination. 2. Atherosclerosis, including left main and three-vessel coronary artery disease. 3.  Subsegmental atelectasis and/or scarring in the left lower lobe. 4.  Additional incidental findings, as above.  Original Report Authenticated By: Florencia Reasons, M.D.   US Abdomen Complete  02/17/2012  *RADIOLOGY REPORT*  Clinical Data:  Abdominal pain  ABDOMINAL ULTRASOUND COMPLETE  Comparison:  02/2012 CT  Findings:  Gallbladder:  Cholelithiasis noted.  Dominant echogenic shadowing gallstone measures 4.5 cm.  Normal wall thickness measuring 2.3 mm. No Murphy's sign.  No evidence of cholecystitis.  Common Bile Duct:  Within normal limits in caliber.  Liver: Diffusely heterogeneous and echogenic anterior right liver and left hepatic lobe, concerning for underlying infiltrative tumor process suchas HCC when correlating with the CT findings. Perihepatic ascites noted.  No biliary dilatation.  IVC:  Appears normal.  Pancreas:  No abnormality identified.  Spleen:  Within normal limits in size and echotexture.  Right kidney:  Normal in size and parenchymal echogenicity.  No evidence of mass or hydronephrosis.  Left kidney:  Normal in size and parenchymal echogenicity.  Upper pole lateral hypoechoic cyst is noted with increased through transmission measuring 2 cm.  Abdominal Aorta:  Atherosclerotic changes.  Negative for aneurysm.  IMPRESSION: Abnormal heterogeneous liver with increased echogenicity throughout the anterior right lobe and the left lobe concerning for an infiltrative tumor process when compared to the CT.  Abdominal ascites  Cholelithiasis but no evidence of wall thickening or Murphy's sign to suggest acute cholecystitis  No biliary dilatation  Left  renal cyst  Original Report Authenticated By: Judie Petit. Ruel Favors, M.D.   Ct Abdomen Pelvis W Contrast  02/16/2012  *RADIOLOGY REPORT*  Clinical Data: Abdominal distension and pain.  CT ABDOMEN AND PELVIS WITH CONTRAST  Technique:  Multidetector CT imaging of the abdomen and pelvis was performed following the standard protocol during bolus administration of intravenous contrast.  Contrast: OMNIPAQUE IOHEXOL 300 MG/ML  SOLN  Comparison: No priors.  Findings:  Lung Bases: Scarring or subsegmental atelectasis in the left lower lobe. There is atherosclerosis of the thoracic aorta and the coronary arteries, including calcified atherosclerotic plaque in the the left main, left anterior descending, left circumflex and right coronary arteries.  Abdomen/Pelvis:  There is a diffusely infiltrative mass in the liver involving segments II, III, IVa/IVb, V and VIII. This mass is heterogeneous in appearance with multifocal areas of low attenuation and heterogeneous enhancement.  The liver generally has a slightly shrunken and nodular contour in the area of the mass, but there appears to be some mild hypertrophy of the right lobe and definite hypertrophy of the caudate lobe.  Numerous gallstones are noted dependently within the lumen of the gallbladder.  The gallbladder wall appears mildly thickened, and there is a small amount of  pericholecystic fluid, however, the overall appearance is not strongly suggestive of acute cholecystitis (findings are compounded by the presence of the surrounding ascites).  The appearance of the pancreas, spleen and bilateral adrenal glands is unremarkable.  Mild parenchymal thinning is noted at multiple regions in the kidneys bilaterally, which likely reflects some scarring.  2.2 cm exophytic cyst in the interpolar region of the left kidney.  There is a densely calcified lesion extending off the upper pole of the left kidney.  A nonobstructive 6 mm calculus is noted in the interpolar collecting  system of the left kidney.  Moderate - large volume of ascites. Extensive atherosclerosis throughout the abdominal and pelvic vasculature, without definite aneurysm or dissection.  Status post lap band procedure.  Numerous colonic diverticulae.  Given the presence of the ascites, accurate assessment for signs of acute colonic diverticulitis is not possible on today's examination.  Numerous dilated perianal and perirectal veins, compatible with hemorrhoids.  Urinary bladder is unremarkable.  A penile prosthesis is noted with a reservoir in the lower left anatomic pelvis.  Musculoskeletal: Diffuse body wall edema There are no aggressive appearing lytic or blastic lesions noted in the visualized portions of the skeleton.  Compression fracture at L1 with approximately 50% loss of anterior vertebral body height has an appearance suggestive of old injury.  IMPRESSION: 1.  The appearance of the liver is highly concerning for an infiltrative neoplasm, and given and the findings of cirrhosis, is most concerning for a diffusely infiltrative hepatocellular carcinoma. 2.  Moderate - large volume of ascites. 3.  Cholelithiasis.  Accurate assessment for acute cholecystitis is not possible on this examination given the adjacent ascites. 4.  Colonic diverticulosis.  Accurate assessment for acute diverticulitis is not possible on this examination given the presence of the ascites. 5. Atherosclerosis, including left main and three-vessel coronary artery disease. 6.  Additional incidental findings, as above.  Original Report Authenticated By: Florencia Reasons, M.D.   US Paracentesis  02/18/2012  *RADIOLOGY REPORT*  Clinical Data: Prior history of bladder carcinoma; now with infiltrative liver tumor and ascites; request is made for diagnostic and therapeutic paracentesis.  ULTRASOUND GUIDED DIAGNOSTIC AND THERAPEUTIC  PARACENTESIS  An ultrasound guided paracentesis was thoroughly discussed with the patient and questions answered.   The benefits, risks, alternatives and complications were also discussed.  The patient understands and wishes to proceed with the procedure.  Written consent was obtained.  Ultrasound was performed to localize and mark an adequate pocket of fluid in the right upper to mid quadrant of the abdomen.  The area was then prepped and draped in the normal sterile fashion.  1% Lidocaine was used for local anesthesia.  Under ultrasound guidance a 19 gauge Yueh catheter was introduced.  Paracentesis was performed.  The catheter was removed and a dressing applied.  Complications:  none  Findings:  A total of approximately 2.8 liters of yellow fluid was removed.  A fluid sample was sent for laboratory analysis.  IMPRESSION: Successful ultrasound guided diagnostic and therapeutic paracentesis yielding 2.8 liters of ascites.  Read by: Jeananne Rama, P.A.-C  Original Report Authenticated By: Waynard Reeds, M.D.   Dg Abd 2 Views  02/16/2012  *RADIOLOGY REPORT*  Clinical Data: Abdominal bloating, history of lap band  ABDOMEN - 2 VIEW  Comparison: Lumbar spine films of 01/18/2011  Findings: Although the orientation of the lap band is rather vertical, this does not appear to have changed when compared to the prior lumbar spine view from  July 2012.  No free air is seen on the erect view.  However, on the supine views there is dilatation of a loop of small bowel in the left abdomen up to 4 cm.  This is worrisome for developing partial small bowel obstruction.  If further assessment is warranted CT of the abdomen pelvis with IV contrast media is recommended.  No colonic distention is seen.  The bones are osteopenic and there are diffuse degenerative changes throughout the lumbar spine.  IMPRESSION:  1. Possible developing partial small bowel obstruction.  Consider CT abdomen and pelvis with IV contrast to assess further. 2.  No free air. 3.  No change in somewhat vertical orientation of the lap band.  Original Report Authenticated By:  Juline Patch, M.D.  Results for orders placed during the hospital encounter of 02/16/12  CBC WITH DIFFERENTIAL      Component Value Range   WBC 8.1  4.0 - 10.5 K/uL   RBC 4.17 (*) 4.22 - 5.81 MIL/uL   Hemoglobin 15.0  13.0 - 17.0 g/dL   HCT 16.1  09.6 - 04.5 %   MCV 103.4 (*) 78.0 - 100.0 fL   MCH 36.0 (*) 26.0 - 34.0 pg   MCHC 34.8  30.0 - 36.0 g/dL   RDW 40.9  81.1 - 91.4 %   Platelets 128 (*) 150 - 400 K/uL   Neutrophils Relative 67  43 - 77 %   Neutro Abs 5.4  1.7 - 7.7 K/uL   Lymphocytes Relative 21  12 - 46 %   Lymphs Abs 1.7  0.7 - 4.0 K/uL   Monocytes Relative 12  3 - 12 %   Monocytes Absolute 1.0  0.1 - 1.0 K/uL   Eosinophils Relative 1  0 - 5 %   Eosinophils Absolute 0.0  0.0 - 0.7 K/uL   Basophils Relative 0  0 - 1 %   Basophils Absolute 0.0  0.0 - 0.1 K/uL  COMPREHENSIVE METABOLIC PANEL      Component Value Range   Sodium 133 (*) 135 - 145 mEq/L   Potassium 4.0  3.5 - 5.1 mEq/L   Chloride 98  96 - 112 mEq/L   CO2 25  19 - 32 mEq/L   Glucose, Bld 70  70 - 99 mg/dL   BUN 20  6 - 23 mg/dL   Creatinine, Ser 7.82  0.50 - 1.35 mg/dL   Calcium 9.1  8.4 - 95.6 mg/dL   Total Protein 7.1  6.0 - 8.3 g/dL   Albumin 2.9 (*) 3.5 - 5.2 g/dL   AST 213 (*) 0 - 37 U/L   ALT 80 (*) 0 - 53 U/L   Alkaline Phosphatase 345 (*) 39 - 117 U/L   Total Bilirubin 1.9 (*) 0.3 - 1.2 mg/dL   GFR calc non Af Amer 78 (*) >90 mL/min   GFR calc Af Amer >90  >90 mL/min  URINALYSIS, ROUTINE W REFLEX MICROSCOPIC      Component Value Range   Color, Urine ORANGE (*) YELLOW   APPearance CLEAR  CLEAR   Specific Gravity, Urine 1.031 (*) 1.005 - 1.030   pH 6.0  5.0 - 8.0   Glucose, UA NEGATIVE  NEGATIVE mg/dL   Hgb urine dipstick NEGATIVE  NEGATIVE   Bilirubin Urine MODERATE (*) NEGATIVE   Ketones, ur TRACE (*) NEGATIVE mg/dL   Protein, ur 30 (*) NEGATIVE mg/dL   Urobilinogen, UA 1.0  0.0 - 1.0 mg/dL   Nitrite POSITIVE (*) NEGATIVE  Leukocytes, UA SMALL (*) NEGATIVE  LIPASE, BLOOD       Component Value Range   Lipase 89 (*) 11 - 59 U/L  URINE MICROSCOPIC-ADD ON      Component Value Range   Squamous Epithelial / LPF RARE  RARE   WBC, UA 0-2  <3 WBC/hpf   Bacteria, UA RARE  RARE   Urine-Other MUCOUS PRESENT    URINE CULTURE      Component Value Range   Specimen Description URINE, RANDOM     Special Requests NONE     Culture  Setup Time 02/17/2012 03:42     Colony Count NO GROWTH     Culture NO GROWTH     Report Status 02/17/2012 FINAL    COMPREHENSIVE METABOLIC PANEL      Component Value Range   Sodium 136  135 - 145 mEq/L   Potassium 3.9  3.5 - 5.1 mEq/L   Chloride 101  96 - 112 mEq/L   CO2 27  19 - 32 mEq/L   Glucose, Bld 88  70 - 99 mg/dL   BUN 17  6 - 23 mg/dL   Creatinine, Ser 1.47  0.50 - 1.35 mg/dL   Calcium 8.8  8.4 - 82.9 mg/dL   Total Protein 6.3  6.0 - 8.3 g/dL   Albumin 2.5 (*) 3.5 - 5.2 g/dL   AST 562 (*) 0 - 37 U/L   ALT 73 (*) 0 - 53 U/L   Alkaline Phosphatase 320 (*) 39 - 117 U/L   Total Bilirubin 2.2 (*) 0.3 - 1.2 mg/dL   GFR calc non Af Amer 69 (*) >90 mL/min   GFR calc Af Amer 80 (*) >90 mL/min  CBC      Component Value Range   WBC 6.7  4.0 - 10.5 K/uL   RBC 3.85 (*) 4.22 - 5.81 MIL/uL   Hemoglobin 13.7  13.0 - 17.0 g/dL   HCT 13.0  86.5 - 78.4 %   MCV 103.4 (*) 78.0 - 100.0 fL   MCH 35.6 (*) 26.0 - 34.0 pg   MCHC 34.4  30.0 - 36.0 g/dL   RDW 69.6 (*) 29.5 - 28.4 %   Platelets 112 (*) 150 - 400 K/uL  HEPATIC FUNCTION PANEL      Component Value Range   Total Protein 6.4  6.0 - 8.3 g/dL   Albumin 2.5 (*) 3.5 - 5.2 g/dL   AST 132 (*) 0 - 37 U/L   ALT 76 (*) 0 - 53 U/L   Alkaline Phosphatase 341 (*) 39 - 117 U/L   Total Bilirubin 1.9 (*) 0.3 - 1.2 mg/dL   Bilirubin, Direct 1.0 (*) 0.0 - 0.3 mg/dL   Indirect Bilirubin 0.9  0.3 - 0.9 mg/dL  HEPATITIS PANEL, ACUTE      Component Value Range   Hepatitis B Surface Ag NEGATIVE  NEGATIVE   HCV Ab NEGATIVE  NEGATIVE   Hep A IgM NEGATIVE  NEGATIVE   Hep B C IgM NEGATIVE  NEGATIVE  HIV  ANTIBODY (ROUTINE TESTING)      Component Value Range   HIV NON REACTIVE  NON REACTIVE  AFP TUMOR MARKER      Component Value Range   AFP-Tumor Marker 2.2  0.0 - 8.0 ng/mL  CEA      Component Value Range   CEA 0.7  0.0 - 5.0 ng/mL  CANCER ANTIGEN 19-9      Component Value Range   CA 19-9 13.2 (*) <35.0 U/mL  AMMONIA      Component Value Range   Ammonia 55  11 - 60 umol/L  PROTIME-INR      Component Value Range   Prothrombin Time 13.9  11.6 - 15.2 seconds   INR 1.05  0.00 - 1.49  BODY FLUID CULTURE      Component Value Range   Specimen Description ASCITIC     Special Requests Normal     Gram Stain       Value: NO WBC SEEN     NO ORGANISMS SEEN   Culture NO GROWTH 2 DAYS     Report Status PENDING    BODY FLUID CELL COUNT WITH DIFFERENTIAL      Component Value Range   Fluid Type-FCT ASCITIC     Color, Fluid STRAW (*) YELLOW   Appearance, Fluid CLEAR  CLEAR   WBC, Fluid 176  0 - 1000 cu mm   Neutrophil Count, Fluid 0  0 - 25 %   Lymphs, Fluid 47     Monocyte-Macrophage-Serous Fluid 53  50 - 90 %   Eos, Fluid 0    LACTATE DEHYDROGENASE, BODY FLUID      Component Value Range   LD, Fluid 41 (*) 3 - 23 U/L   Fluid Type-FLDH ASCITIC    PROTEIN, BODY FLUID      Component Value Range   Total protein, fluid 0.8     Fluid Type-FTP ASCITIC    PATHOLOGIST SMEAR REVIEW      Component Value Range   Path Review Reviewed by Ferd Hibbs. Colonel Bald, M.D.    COMPREHENSIVE METABOLIC PANEL      Component Value Range   Sodium 137  135 - 145 mEq/L   Potassium 3.7  3.5 - 5.1 mEq/L   Chloride 100  96 - 112 mEq/L   CO2 27  19 - 32 mEq/L   Glucose, Bld 107 (*) 70 - 99 mg/dL   BUN 17  6 - 23 mg/dL   Creatinine, Ser 3.08  0.50 - 1.35 mg/dL   Calcium 9.2  8.4 - 65.7 mg/dL   Total Protein 6.3  6.0 - 8.3 g/dL   Albumin 2.6 (*) 3.5 - 5.2 g/dL   AST 846 (*) 0 - 37 U/L   ALT 71 (*) 0 - 53 U/L   Alkaline Phosphatase 333 (*) 39 - 117 U/L   Total Bilirubin 1.5 (*) 0.3 - 1.2 mg/dL   GFR calc non Af  Amer 62 (*) >90 mL/min   GFR calc Af Amer 72 (*) >90 mL/min  PROTIME-INR      Component Value Range   Prothrombin Time 14.1  11.6 - 15.2 seconds   INR 1.07  0.00 - 1.49  CBC      Component Value Range   WBC 8.7  4.0 - 10.5 K/uL   RBC 4.03 (*) 4.22 - 5.81 MIL/uL   Hemoglobin 14.4  13.0 - 17.0 g/dL   HCT 96.2  95.2 - 84.1 %   MCV 104.0 (*) 78.0 - 100.0 fL   MCH 35.7 (*) 26.0 - 34.0 pg   MCHC 34.4  30.0 - 36.0 g/dL   RDW 32.4 (*) 40.1 - 02.7 %   Platelets 109 (*) 150 - 400 K/uL   A/P: Pt with infiltrative liver neoplasm, cirrhosis, ascites; plan is for US guided liver lesion biopsy with possible paracentesis on 8/13. Details/risks of procedure d/w pt/family with their understanding and consent.

## 2012-02-20 NOTE — Progress Notes (Signed)
[facility] Progress Note  Subjective: Patient feeling ok today.  Abdomen seems larger than yesterday, according to his wife.  Reports that abdomen is uncomfortable when you push on it.  Slightly confused, but wife says not really different from baseline.  Objective:  Vital signs in last 24 hours: Temp:  [97.2 F (36.2 C)-98.2 F (36.8 C)] 97.2 F (36.2 C) (08/12 0407) Pulse Rate:  [80-89] 80  (08/12 0407) Resp:  [16-20] 18  (08/12 0407) BP: (119-141)/(62-80) 123/76 mmHg (08/12 0407) SpO2:  [92 %-94 %] 92 % (08/12 0407) Weight:  [212 lb 4.9 oz (96.3 kg)] 212 lb 4.9 oz (96.3 kg) (08/12 0407) Last BM Date: 02/17/12 General:   Alert,  Well-developed, in NAD.  Slightly confused. Heart:  Regular rate and rhythm; no murmurs. Pulm:  Bibasilar crackles. Abdomen:  Softly distended with fluid. Normal bowel sounds, without guarding, and without rebound.  Bulging flanks.  Diffuse minimal TTP.   Extremities:  Without edema. Neurologic:  Alert and  oriented x4;  grossly normal neurologically. Psych:  Alert and cooperative. Normal mood and affect.  No asterixis.  Intake/Output from previous day: 08/11 0701 - 08/12 0700 In: 480 [P.O.:480] Out: 2425 [Urine:2425]  Lab Results:  CEA, CA19-9, and AFP WNL's. Hepatitis panel is pending.  Basename 02/20/12 0353  WBC 8.7  HGB 14.4  HCT 41.9  PLT 109*   BMET  Basename 02/20/12 0353  NA 137  K 3.7  CL 100  CO2 27  GLUCOSE 107*  BUN 17  CREATININE 1.12  CALCIUM 9.2   LFT  Basename 02/20/12 0353 02/17/12 1744  PROT 6.3 --  ALBUMIN 2.6* --  AST 157* --  ALT 71* --  ALKPHOS 333* --  BILITOT 1.5* --  BILIDIR -- 1.0*  IBILI -- 0.9   PT/INR  Basename 02/20/12 0353 02/17/12 1933  LABPROT 14.1 13.9  INR 1.07 1.05   Assessment / Plan: 1. Liver mass diffusely infiltrative-? Cholangiocarcinoma vs HCC.  AFP, CA19-9, and CEA WNL's.  Will plan for percutaneous liver biopsy hopefully 8/13; currently working with radiology  to determine best access to the lesion (U/S vs CT). 2. Cirrhosis-C/B esophageal varices, portal gastropathy, thrombocytopenia, and ascites; unknown source of liver disease.  Viral hepatitis panel is pending.  Is on lasix 80mg  daily, which will be continued; will increase spironolactone to 200mg .  Should consider starting non-selective beta-blocker at sometime in the future post liver biopsy.  3. Nausea and vomiting-EGD on 02/18/2010 showed no abnormalities to account for these symptoms.  Is on protonix 80mg  daily; will continue.   Active Problems:  UTI (lower urinary tract infection)  Liver tumor  Vomiting  Hyponatremia  Cholelithiasis without obstruction  Nonspecific (abnormal) findings on radiological and other examination of biliary tract  Nonspecific elevation of levels of transaminase or lactic acid dehydrogenase (LDH)  Esophageal varices     LOS: 4 days   Demetrius Barrell D.  02/20/2012, 9:27 AM

## 2012-02-20 NOTE — Progress Notes (Signed)
PROGRESS NOTE  Lance Best XBJ:[id number] DOB: 1933-10-19 DOA: 02/16/2012 PCP: [name], MD  Brief narrative: 78 yr old male admit with N/V ascites and ? HCC  Past medical history-As per Problem list Chart reviewed as below- H/o syncopy followed by Dr. [name] 12/30/10 c Loop recorder in situ-not a candidate for pacmaker  Consultants:  Gi-Dr. [name]  Procedures:  Abdominal x-ray 02/16/2012 = possible developing partial small bowel obstruction, consider CT abdomen pelvis with IV contrast  CT abdomen pelvis 02/16/2012 = liver highly concerning for infiltrative neoplasm and given findings of cirrhosis most concerning for diffusely infiltrative hepatocellular carcinoma, moderate to large volume ascites, cholelithiasis, colonic diverticula  Ultrasound abdomen 89 = abnormal heterogeneous liver with increased echogenicity to anterior right lobe the left lobe concerning for infiltrative tumor process when compared to CT  CT chest 89 = no findings suggest metastatic disease to thorax  CT head 8/9 = advanced chronic small vessel disease with old infarcts and right frontal and parietal lobes  Ultrasound paracentesis 8/10 = 2.8 L via the fluid removed with samples sent to lab for analysis  Antibiotics:  Rocephin 02/16/12-02/19/12  Urine culture no growth   Subjective  Doing fair.  Stooling a lot. No N/v.  Soreness to tummy has worn off since yesterday Tolerated Upper Gi scope today without event.  Stomach feels better Eating fat restricted diet.  Family in room No cp/n/v/sob/ha   Objective    Interim History: Appreciate gastroenterology input  Telemetry: Non-telemetry   Objective: Filed Vitals:   02/19/12 1400 02/19/12 2104 02/20/12 0407 02/20/12 1400  BP: 141/80 125/75 123/76 120/73  Pulse: 87 89 80 90  Temp: 98 F (36.7 C) 97.8 F (36.6 C) 97.2 F (36.2 C) 97.6 F (36.4 C)  TempSrc: Oral Oral Oral Oral  Resp: 16 20 18 20   Height:      Weight:   96.3 kg (212  lb 4.9 oz)   SpO2: 94% 93% 92% 91%    Intake/Output Summary (Last 24 hours) at 02/20/12 1423 Last data filed at 02/20/12 1409  Gross per 24 hour  Intake    840 ml  Output   1950 ml  Net  -1110 ml    Exam:  HEENT-alert, oriented, temporal wasting  CTa b, no added sound  CARDIAC-s1 s2 no added sound mno m/r/g  ABDOMEN-distended tympanitic, decreased flank fullness  NEURO-NO asterixis, no clonus  SKIN/MUSCULAR-No splotches  Data Reviewed: Basic Metabolic Panel:  Lab 02/20/12 3086 02/17/12 0440 02/16/12 1953  NA 137 136 133*  K 3.7 3.9 --  CL 100 101 98  CO2 27 27 25   GLUCOSE 107* 88 70  BUN 17 17 20   CREATININE 1.12 1.03 0.98  CALCIUM 9.2 8.8 9.1  MG -- -- --  PHOS -- -- --   Liver Function Tests:  Lab 02/20/12 0353 02/17/12 1744 02/17/12 0440 02/16/12 1953  AST 157* 160* 150* 180*  ALT 71* 76* 73* 80*  ALKPHOS 333* 341* 320* 345*  BILITOT 1.5* 1.9* 2.2* 1.9*  PROT 6.3 6.4 6.3 7.1  ALBUMIN 2.6* 2.5* 2.5* 2.9*    Lab 02/16/12 1953  LIPASE 89*  AMYLASE --    Lab 02/17/12 1808  AMMONIA 55   CBC:  Lab 02/20/12 0353 02/17/12 0440 02/16/12 1953  WBC 8.7 6.7 8.1  NEUTROABS -- -- 5.4  HGB 14.4 13.7 15.0  HCT 41.9 39.8 43.1  MCV 104.0* 103.4* 103.4*  PLT 109* 112* 128*   Cardiac Enzymes: No results found for this basename:  CKTOTAL:5,CKMB:5,CKMBINDEX:5,TROPONINI:5 in the last 168 hours BNP: No components found with this basename: POCBNP:5 CBG: No results found for this basename: GLUCAP:5 in the last 168 hours  Recent Results (from the past 240 hour(s))  URINE CULTURE     Status: Normal   Collection Time   02/16/12  7:27 PM      Component Value Range Status Comment   Specimen Description URINE, RANDOM   Final    Special Requests NONE   Final    Culture  Setup Time 02/17/2012 03:42   Final    Colony Count NO GROWTH   Final    Culture NO GROWTH   Final    Report Status 02/17/2012 FINAL   Final   BODY FLUID CULTURE     Status: Normal (Preliminary  result)   Collection Time   02/18/12 11:07 AM      Component Value Range Status Comment   Specimen Description ASCITIC   Final    Special Requests Normal   Final    Gram Stain     Final    Value: NO WBC SEEN     NO ORGANISMS SEEN   Culture NO GROWTH 2 DAYS   Final    Report Status PENDING   Incomplete      Studies:              All Imaging reviewed and is as per above notation   Scheduled Meds:    . enoxaparin (LOVENOX) injection  40 mg Subcutaneous Q24H  . fluticasone  1 spray Each Nare Daily  . furosemide  80 mg Oral Daily  . gabapentin  600 mg Oral TID  . pantoprazole  80 mg Oral Q1200  . spironolactone  100 mg Oral Once  . spironolactone  200 mg Oral Daily  . Tamsulosin HCl  0.4 mg Oral Daily  . DISCONTD: cefTRIAXone (ROCEPHIN)  IV  1 g Intravenous QHS  . DISCONTD: ezetimibe-simvastatin  1 tablet Oral Daily  . DISCONTD: spironolactone  100 mg Oral Daily   Continuous Infusions:     Assessment/Plan: 1. ?  Cholangiocarcinoma-per gastroenterology-appreciate input.  AFB = 2.2, HIV negative, CEA = 0.7, CA 19-9 = 13.2 other labs etc. still pending.  Per my discussion c Dr. [name] 8/11, Plan would be for Paracentesis + IR guided Biopsy liver 8/12.  Family/Patient aware.  Rpt Cmet/Inr in am-Once tapped, will need Albumin to replace 3rd space losses. Cytology/cell count ordered.  Discussion re: this diagnosis deferred to GI.  Will contact Oncology as prn. 2. Ascites secondary to #1-continue diuretics to spironolactone 100 by mouth daily, Lasix 80 mg daily.  Ascites tapped 8/10.  Will need rpt Tap c Biospy 8/12-Pending 3. Relative TCP-Drop from 01/16/09 (188)-monitor for bleeding.  guaic stools 4. History gastric banding in [place]-all meals should be small, nutritionist to help with counseling regarding this 5. NO UTI Rocephin d/c 8/11. 6. Hyponatremia-resolved with IV fluid repletion 7. Altered LFTs without encephalopathy-secondary to #1, ammonia 55 8. Coincidental  cholelithiasis-surgeon consulted on admit-Will involve them formally if prn. Gastroenterology is coordinating CT/sono guided liver biopsy-KINDLY discuss this with his wife/family-re: suspicions of #1  9. History syncope followed by electrophysiology-has loop recorder in place, no further symptoms other than mild dizziness, potentially related to mild hypovolemia from fluid tap 10. Nausea vomiting-resolved. 11. History of bladder cancer-OP F/u.  Will obtain records.  Requested permissions from Patient/family. Secretary to obtain please 12. History remote CVA 13. History diabetes mellitus-blood sugar is low.  Hold any  insulin for now.  Check am fasting glucose 8/11-was 107 14. History hyperlipidemia-hold statin for now 15. History of peripheral neuropathy-stable  Code Status: Full code Family Communication: Discussed my role in patient's care-I will manage peripheral medical problems but will be guided by gastroenterology regarding workup and disposition.  Appreciate input. Disposition Plan: Inpatient   Pleas Koch, MD  [facility] Pager [phone] 02/20/2012, 2:23 PM    LOS: 4 days

## 2012-02-21 ENCOUNTER — Inpatient Hospital Stay (HOSPITAL_COMMUNITY): Payer: Medicare Other

## 2012-02-21 DIAGNOSIS — K7290 Hepatic failure, unspecified without coma: Secondary | ICD-10-CM

## 2012-02-21 LAB — CBC
MCH: 35.6 pg — ABNORMAL HIGH (ref 26.0–34.0)
MCHC: 34.7 g/dL (ref 30.0–36.0)
Platelets: 107 10*3/uL — ABNORMAL LOW (ref 150–400)

## 2012-02-21 LAB — PROTIME-INR
INR: 1.02 (ref 0.00–1.49)
Prothrombin Time: 13.6 seconds (ref 11.6–15.2)

## 2012-02-21 MED ORDER — FENTANYL CITRATE 0.05 MG/ML IJ SOLN
INTRAMUSCULAR | Status: AC | PRN
  Administered 2012-02-21: 50 ug via INTRAVENOUS

## 2012-02-21 MED ORDER — MIDAZOLAM HCL 5 MG/5ML IJ SOLN
INTRAMUSCULAR | Status: AC | PRN
  Administered 2012-02-21: 1 mg via INTRAVENOUS

## 2012-02-21 NOTE — Progress Notes (Signed)
[facility] Progress Note  Subjective:  Patient just returned from liver biopsy.  Says that he is feeling ok right now with no complaints of abdominal pain.  Denies nausea and vomiting.  Is hungry.  No immediate complications, according to the report.  It does not appear that paracentesis was needed/performed.  Objective:  Vital signs in last 24 hours: Temp:  [97.3 F (36.3 C)-98.7 F (37.1 C)] 98.7 F (37.1 C) (08/13 0515) Pulse Rate:  [87-90] 87  (08/13 0515) Resp:  [18-20] 20  (08/13 0515) BP: (109-120)/(68-73) 115/70 mmHg (08/13 0515) SpO2:  [91 %-94 %] 91 % (08/13 0515) Weight:  [209 lb 3.5 oz (94.9 kg)] 209 lb 3.5 oz (94.9 kg) (08/13 0515) Last BM Date: 02/20/12 General:   Alert,  Well-developed, in NAD Heart:  Regular rate and rhythm; no murmurs Pulm:  CTAB; no W/R/R Abdomen:  Soft with mild distention from fluid.  Normal bowel sounds, without guarding, and without rebound.  Nontender to palpation.  Lap-band port noted in mid-abdomen near umbilicus.     Extremities:  Without edema. Neurologic:  Alert and  oriented x4;  grossly normal neurologically. Psych:  Alert and cooperative. Normal mood and affect.  Intake/Output from previous day: 08/12 0701 - 08/13 0700 In: 480 [P.O.:480] Out: 950 [Urine:950] Intake/Output this shift: Total I/O In: 160 [P.O.:160] Out: -   Lab Results:  Basename 02/21/12 0415 02/20/12 0353  WBC 7.8 8.7  HGB 13.5 14.4  HCT 38.9* 41.9  PLT 107* 109*   BMET  Basename 02/20/12 0353  NA 137  K 3.7  CL 100  CO2 27  GLUCOSE 107*  BUN 17  CREATININE 1.12  CALCIUM 9.2   LFT  Basename 02/20/12 0353  PROT 6.3  ALBUMIN 2.6*  AST 157*  ALT 71*  ALKPHOS 333*  BILITOT 1.5*  BILIDIR --  IBILI --   PT/INR  Basename 02/21/12 0415 02/20/12 0353  LABPROT 13.6 14.1  INR 1.02 1.07   Hepatitis Panel Hep A, B, and C negative  Assessment / Plan: 1. Liver mass diffusely infiltrative-? Cholangiocarcinoma vs HCC. AFP, CA19-9,  and CEA WNL's. Patient underwent percutaneous liver biopsy via ultrasound guidance today.  Will follow-up path. 2. Cirrhosis-C/B esophageal varices, portal gastropathy, thrombocytopenia, and ascites; unknown source of liver disease (likely NASH). Viral hepatitis panel is negative. Is on lasix 80mg  daily and spironolactone to 200mg . Should consider starting non-selective beta-blocker at sometime in the future.  Continue to monitor electrolytes, LFT's and coags. 3. Nausea and vomiting-EGD on 02/18/2010 showed no abnormalities to account for these symptoms. Is on protonix 80mg  daily; will continue.  Active Problems:  UTI (lower urinary tract infection)  Liver tumor  Vomiting  Hyponatremia  Cholelithiasis without obstruction  Nonspecific (abnormal) findings on radiological and other examination of biliary tract  Nonspecific elevation of levels of transaminase or lactic acid dehydrogenase (LDH)  Esophageal varices     LOS: 5 days   Marieke Lubke D.  02/21/2012, 10:51 AM

## 2012-02-21 NOTE — Evaluation (Signed)
Physical Therapy Evaluation Patient Details Name: Lance Best MRN: [id number] DOB: 1933/07/19 Today's Date: 02/21/2012 Time: 9528-4132 PT Time Calculation (min): 11 min  PT Assessment / Plan / Recommendation Clinical Impression  Pt admitted for nausea and vomiting with abdominal ascites requiring paracentesis.  Pt doing well with mobility and currently at baseline.  Pt reports 10 yr hx of mild dizziness and seeing "all sorts" of doctors.  Pt only reported mild dizziness toward end of ambulation.  Recommended to pt to ambulate with nsg staff during hospitalization.    PT Assessment  Patent does not need any further PT services    Follow Up Recommendations  No PT follow up    Barriers to Discharge        Equipment Recommendations  None recommended by PT    Recommendations for Other Services     Frequency      Precautions / Restrictions Precautions Precautions: None Restrictions Weight Bearing Restrictions: No   Pertinent Vitals/Pain No pain      Mobility  Bed Mobility Bed Mobility: Supine to Sit Supine to Sit: 6: Modified independent (Device/Increase time);HOB elevated Transfers Transfers: Stand to Sit;Sit to Stand Sit to Stand: 6: Modified independent (Device/Increase time);With upper extremity assist;From bed;From toilet Stand to Sit: 6: Modified independent (Device/Increase time);With upper extremity assist;To chair/3-in-1 Ambulation/Gait Ambulation/Gait Assistance: 5: Supervision Ambulation Distance (Feet): 400 Feet Assistive device: None Ambulation/Gait Assistance Details: supervision due to pt reports 10 yr hx of mild dizziness, also pt able to walk and turn head toward therapist to talk without balance disruption, performed stop and 360 degree turn with slight LOB but able to self correct Gait Pattern: Within Functional Limits    Exercises     PT Diagnosis:    PT Problem List:   PT Treatment Interventions:     PT Goals    Visit Information  Last PT  Received On: 02/21/12 Assistance Needed: +1 PT/OT Co-Evaluation/Treatment: Yes    Subjective Data  Subjective: They already took a bunch of fluid of my stomach and now they may have to take more.   Prior Functioning  Home Living Lives With: Spouse Available Help at Discharge: Family Type of Home: Apartment Home Access: Stairs to enter Secretary/administrator of Steps: 1+1 Home Layout: One level Bathroom Shower/Tub: Engineer, manufacturing systems: Standard Home Adaptive Equipment: Grab bars in shower;Grab bars around toilet;Shower chair without back Prior Function Level of Independence: Needs assistance Needs Assistance: Meal Prep;Light Housekeeping Meal Prep: Total Light Housekeeping: Total Able to Take Stairs?: Yes Vocation: Retired Comments: No assistive device for ambulation Communication Communication: No difficulties    Cognition  Overall Cognitive Status: Appears within functional limits for tasks assessed/performed Arousal/Alertness: Awake/alert Orientation Level: Appears intact for tasks assessed Behavior During Session: [facility] for tasks performed    Extremity/Trunk Assessment Right Upper Extremity Assessment RUE ROM/Strength/Tone: [facility] for tasks assessed Left Upper Extremity Assessment LUE ROM/Strength/Tone: [facility] for tasks assessed Right Lower Extremity Assessment RLE ROM/Strength/Tone: [facility] for tasks assessed Left Lower Extremity Assessment LLE ROM/Strength/Tone: [facility] for tasks assessed   Balance    End of Session PT - End of Session Activity Tolerance: Patient tolerated treatment well Patient left: in chair;with call bell/phone within reach;with family/visitor present  GP     Rimsha Trembley,KATHrine E 02/21/2012, 10:32 AM Pager: [phone]

## 2012-02-21 NOTE — Progress Notes (Signed)
Report given to [facility] Patient taken to radiology for biopsy

## 2012-02-21 NOTE — Progress Notes (Signed)
Stool sent to lab for heme testing- positive test result

## 2012-02-21 NOTE — Plan of Care (Signed)
Problem: Phase II Progression Outcomes Goal: Progress activity as tolerated unless otherwise ordered Outcome: Progressing Pt needs frequent encouragement for ambulation- standby asst

## 2012-02-21 NOTE — Progress Notes (Signed)
PROGRESS NOTE  Lance Best JYN:[id number] DOB: Nov 22, 1933 DOA: 02/16/2012 PCP: [name], MD  Brief narrative: 78 yr old male admitted 02/17/2012 with N/V ascites and a history of gastric bypass presented from [facility] with diffuse abdominal extension pain and concern for small bowel obstruction.  He has a history of a lap band procedure done in 2005.he on exam was found to be ascitic and had fluid tap 8/9 and gastroenterology was consulted who have been orchestrating his care.  He had another ascitic fluid tap 8/13 this time with biopsy of a heterogeneous liver mass suspected strongly to be some day of biliary carcinoma.  Dr. Rhea Belton has discussed with me on multiple occasions his care and his input has been appreciated-currently the patient will continue diuretics, we will await preliminary biopsy results of liver biopsy done 8/13 and patient potentially will be transferred to a tertiary care center after discussion via gastroenterology to surgeons here.    Past medical history-As per Problem list Chart reviewed as below- H/o syncopy followed by Dr. [name] 12/30/10 c Loop recorder in situ-not a candidate for pacmaker  Consultants:  Gi-Dr. [name]   interventional radiology-Dr. Fredia Sorrow  Procedures:  Abdominal x-ray 02/16/2012 = possible developing partial small bowel obstruction, consider CT abdomen pelvis with IV contrast  CT abdomen pelvis 02/16/2012 = liver highly concerning for infiltrative neoplasm and given findings of cirrhosis most concerning for diffusely infiltrative hepatocellular carcinoma, moderate to large volume ascites, cholelithiasis, colonic diverticula  Ultrasound abdomen 89 = abnormal heterogeneous liver with increased echogenicity to anterior right lobe the left lobe concerning for infiltrative tumor process when compared to CT  CT chest 8/9 = no findings suggest metastatic disease to thorax  CT head 8/9 = advanced chronic small vessel disease with old  infarcts and right frontal and parietal lobes  Upper endoscopy 8/11 showed grade 2 varices and distal esophagus, total hypertensive gastropathy   Ultrasound paracentesis 8/10 = 2.8 L via the fluid removed with samples sent to lab for analysis   ultrasound paracentesis 8/13 = 4 L tapped-patient had liver biopsy percutaneously is all done up sleep results which are pending  Antibiotics:  Rocephin 02/16/12-02/19/12  Urine culture no growth   Subjective  Seems to be doing well-ambulated so well that therapy released him from their care  Eating fat restricted diet.  Family in room No cp/n/v/sob/ha   Objective    Interim History: Appreciate gastroenterology input  Telemetry: Non-telemetry   Objective: Filed Vitals:   02/21/12 1230 02/21/12 1235 02/21/12 1241 02/21/12 1259  BP: 121/66 122/71 114/69 119/72  Pulse: 82 85 86 78  Temp:    98.2 F (36.8 C)  TempSrc:      Resp: 13 11 14 16   Height:      Weight:      SpO2: 97% 96% 96% 92%    Intake/Output Summary (Last 24 hours) at 02/21/12 1640 Last data filed at 02/21/12 1620  Gross per 24 hour  Intake    280 ml  Output    775 ml  Net   -495 ml    Exam:  HEENT-alert, oriented, temporal wasting  CTa b, no added sound  CARDIAC-s1 s2 no added sound mno m/r/g  ABDOMEN-distended tympanitic, decreased flank fullness  NEURO-NO asterixis, no clonus  SKIN/MUSCULAR-No splotches  Data Reviewed: Basic Metabolic Panel:  Lab 02/20/12 8657 02/17/12 0440 02/16/12 1953  NA 137 136 133*  K 3.7 3.9 --  CL 100 101 98  CO2 27 27 25   GLUCOSE  107* 88 70  BUN 17 17 20   CREATININE 1.12 1.03 0.98  CALCIUM 9.2 8.8 9.1  MG -- -- --  PHOS -- -- --   Liver Function Tests:  Lab 02/20/12 0353 02/17/12 1744 02/17/12 0440 02/16/12 1953  AST 157* 160* 150* 180*  ALT 71* 76* 73* 80*  ALKPHOS 333* 341* 320* 345*  BILITOT 1.5* 1.9* 2.2* 1.9*  PROT 6.3 6.4 6.3 7.1  ALBUMIN 2.6* 2.5* 2.5* 2.9*    Lab 02/16/12 1953  LIPASE 89*  AMYLASE  --    Lab 02/17/12 1808  AMMONIA 55   CBC:  Lab 02/21/12 0415 02/20/12 0353 02/17/12 0440 02/16/12 1953  WBC 7.8 8.7 6.7 8.1  NEUTROABS -- -- -- 5.4  HGB 13.5 14.4 13.7 15.0  HCT 38.9* 41.9 39.8 43.1  MCV 102.6* 104.0* 103.4* 103.4*  PLT 107* 109* 112* 128*   Cardiac Enzymes: No results found for this basename: CKTOTAL:5,CKMB:5,CKMBINDEX:5,TROPONINI:5 in the last 168 hours BNP: No components found with this basename: POCBNP:5 CBG: No results found for this basename: GLUCAP:5 in the last 168 hours  Recent Results (from the past 240 hour(s))  URINE CULTURE     Status: Normal   Collection Time   02/16/12  7:27 PM      Component Value Range Status Comment   Specimen Description URINE, RANDOM   Final    Special Requests NONE   Final    Culture  Setup Time 02/17/2012 03:42   Final    Colony Count NO GROWTH   Final    Culture NO GROWTH   Final    Report Status 02/17/2012 FINAL   Final   BODY FLUID CULTURE     Status: Normal (Preliminary result)   Collection Time   02/18/12 11:07 AM      Component Value Range Status Comment   Specimen Description ASCITIC   Final    Special Requests Normal   Final    Gram Stain     Final    Value: NO WBC SEEN     NO ORGANISMS SEEN   Culture NO GROWTH 3 DAYS   Final    Report Status PENDING   Incomplete      Studies:              All Imaging reviewed and is as per above notation   Scheduled Meds:    . fluticasone  1 spray Each Nare Daily  . furosemide  80 mg Oral Daily  . gabapentin  600 mg Oral TID  . pantoprazole  80 mg Oral Q1200  . spironolactone  200 mg Oral Daily  . Tamsulosin HCl  0.4 mg Oral Daily   Continuous Infusions:     Assessment/Plan: 1. ?   Liver mass in the setting of potential-NASH-per gastroenterology-appreciate input.  AFB = 2.2, HIV negative, CEA = 0.7, CA 19-9 = 13.2 patient had r Paracentesis + IR guided Biopsy liver 8/13-awaiting biopsy results of liver.  Discussion re: this diagnosis deferred to GI.  Will  contact Oncology as prn. 2. Ascites secondary to #1-continue diuretics to spironolactone 200 by mouth daily, Lasix 80 mg daily.  Ascites tapped 8/10 as well as 8/13 .   3. Relative TCP-Drop from 01/16/09 (188)-monitor for bleeding.  guaic stools are Hemoccult positive.hemoglobin stable however  4. History gastric banding in [place]-all meals should be small, nutritionist to help with counseling regarding this 5. NO UTI Rocephin d/c 8/11. 6. Hyponatremia-resolved with IV fluid repletion 7. Altered LFTs without  encephalopathy-secondary to #1, ammonia 55 8. Coincidental cholelithiasis-surgeon consulted on admit-Will involve them formally if prn.  9. History syncope followed by electrophysiology-has loop recorder in place, no further symptoms other than mild dizziness, potentially related to mild hypovolemia from fluid tap 10. Nausea vomiting-resolved. 11. History of bladder cancer-OP F/u.  12. History remote CVA 13. History diabetes mellitus-blood sugar is low.  Hold any insulin for now.  Check am fasting glucose 8/11-was 107 14. History hyperlipidemia-hold statin for now 15. History of peripheral neuropathy-stable  Code Status: Full code Family Communication: Discussed my role in patient's care-I will manage peripheral medical problems but will be guided by gastroenterology regarding workup and disposition.  Appreciate input. Disposition Plan: Inpatient-patient potentially will be at least preliminarily worked up and then can either be transferred to a tertiary care center discharged with close GI/surg-ONC followup    Pleas Koch, MD  [facility] Pager [phone] 02/21/2012, 4:40 PM    LOS: 5 days

## 2012-02-21 NOTE — Evaluation (Signed)
Occupational Therapy Evaluation Patient Details Name: Lance Best MRN: [id number] DOB: March 27, 1934 Today's Date: 02/21/2012 Time: 4098-1191 OT Time Calculation (min): 11 min  OT Assessment / Plan / Recommendation Clinical Impression   Pt is a 78 yo male who presents with abdominal ascites, syncopal episodes. No dizziness reported at eval. Pt is mod I with all ADLs and will have prn A from wife upon return home.     OT Assessment  Patient does not need any further OT services    Follow Up Recommendations  No OT follow up    Barriers to Discharge      Equipment Recommendations  None recommended by OT    Recommendations for Other Services    Frequency       Precautions / Restrictions Restrictions Weight Bearing Restrictions: No   Pertinent Vitals/Pain No c/o    ADL  Grooming: Simulated;Modified independent Where Assessed - Grooming: Unsupported standing Upper Body Bathing: Simulated;Modified independent Where Assessed - Upper Body Bathing: Unsupported sitting Lower Body Bathing: Simulated;Modified independent Where Assessed - Lower Body Bathing: Unsupported sit to stand Upper Body Dressing: Simulated;Modified independent Where Assessed - Upper Body Dressing: Unsupported sitting Lower Body Dressing: Performed;Modified independent Where Assessed - Lower Body Dressing: Unsupported sit to stand Toilet Transfer: Performed;Modified independent Toilet Transfer Method: Sit to Barista: Regular height toilet Toileting - Clothing Manipulation and Hygiene: Simulated;Modified independent Where Assessed - Toileting Clothing Manipulation and Hygiene: Sit to stand from 3-in-1 or toilet Transfers/Ambulation Related to ADLs: Pt ambulated one lap around the unit with supervision. ADL Comments: Increased time needed for all functional tasks.    OT Diagnosis:    OT Problem List:   OT Treatment Interventions:     OT Goals    Visit Information  Last OT Received  On: 02/21/12 Assistance Needed: +1 PT/OT Co-Evaluation/Treatment: Yes    Subjective Data  Subjective: I've been doing everything for myself here in the hospital. Patient Stated Goal: Return home with wife.   Prior Functioning  Vision/Perception  Home Living Lives With: Spouse Available Help at Discharge: Family Type of Home: Apartment Home Access: Stairs to enter Secretary/administrator of Steps: 1+1 Home Layout: One level Bathroom Shower/Tub: Engineer, manufacturing systems: Standard Home Adaptive Equipment: Grab bars in shower;Grab bars around toilet;Shower chair without back Prior Function Level of Independence: Needs assistance Needs Assistance: Meal Prep;Light Housekeeping Meal Prep: Total Light Housekeeping: Total Able to Take Stairs?: Yes Vocation: Retired Musician: No difficulties      Cognition  Overall Cognitive Status: Appears within functional limits for tasks assessed/performed Arousal/Alertness: Awake/alert Orientation Level: Appears intact for tasks assessed Behavior During Session: [facility] for tasks performed    Extremity/Trunk Assessment Right Upper Extremity Assessment RUE ROM/Strength/Tone: [facility] for tasks assessed Left Upper Extremity Assessment LUE ROM/Strength/Tone: WFL for tasks assessed   Mobility Bed Mobility Bed Mobility: Supine to Sit Supine to Sit: 6: Modified independent (Device/Increase time);HOB elevated Transfers Transfers: Sit to Stand;Stand to Sit Sit to Stand: 6: Modified independent (Device/Increase time);With upper extremity assist;From bed;From toilet Stand to Sit: 6: Modified independent (Device/Increase time);With upper extremity assist;To chair/3-in-1   Exercise    Balance    End of Session OT - End of Session Activity Tolerance: Patient tolerated treatment well Patient left: in chair;with call bell/phone within reach;with family/visitor present  GO     Vandana Haman A OTR/L [phone] 02/21/2012, 10:04  AM

## 2012-02-21 NOTE — Procedures (Signed)
Technically successful US guided biopsy of infiltrative mass involving the GB fossa.  No immediate complications.

## 2012-02-21 NOTE — Progress Notes (Signed)
Patient seen, examined, and I agree with the above documentation, including the assessment and plan. Doing well after liver bx.  No pain. Awaiting path results.  If positive then consult to onc and [facility] Continue current doses of diuretics for ascites management.

## 2012-02-22 DIAGNOSIS — C221 Intrahepatic bile duct carcinoma: Principal | ICD-10-CM | POA: Diagnosis present

## 2012-02-22 DIAGNOSIS — K766 Portal hypertension: Secondary | ICD-10-CM

## 2012-02-22 DIAGNOSIS — R188 Other ascites: Secondary | ICD-10-CM

## 2012-02-22 DIAGNOSIS — K319 Disease of stomach and duodenum, unspecified: Secondary | ICD-10-CM

## 2012-02-22 LAB — BODY FLUID CULTURE: Special Requests: NORMAL

## 2012-02-22 MED ORDER — ENOXAPARIN SODIUM 40 MG/0.4ML ~~LOC~~ SOLN
40.0000 mg | SUBCUTANEOUS | Status: DC
  Administered 2012-02-22 – 2012-02-23 (×2): 40 mg via SUBCUTANEOUS
  Filled 2012-02-22 (×2): qty 0.4

## 2012-02-22 MED ORDER — NADOLOL 20 MG PO TABS
20.0000 mg | ORAL_TABLET | Freq: Every day | ORAL | Status: DC
  Administered 2012-02-22 – 2012-02-23 (×2): 20 mg via ORAL
  Filled 2012-02-22 (×3): qty 1

## 2012-02-22 NOTE — Progress Notes (Signed)
[facility] PROGRESS NOTE  Lance Best WJX:[id number] DOB: [birth year] DOA: 02/16/2012 PCP: [name], MD  Assessment/Plan: 1. 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 At this point reviewing images with surgery felt to be non-resectable in nature and that patient due to cirrhosis and ascites not a candidate for surgery. Will follow oncology recommendations (consulted on 02/22/12).  2. Ascites secondary to #1-continue diuretics to spironolactone and Lasix. Ascites tapped 8/10 as well as 8/13. Feels better. But if ascites 2/2 malignancy will be more resistant to diuretics and might required repetitive low  Volume paracentesis. GI on board and in agreement of plan; will follow rec's.  3. Hyponatremia-resolved with IV fluid repletion; will check BMET in am (especially now he is on diuretics tx) 4. Altered LFTs without encephalopathy-secondary to #1, ammonia 55; will monitor 5. History syncope followed by electrophysiology-has loop recorder in place, no further symptoms other than mild dizziness, potentially related to mild hypovolemia from fluid tap and diuretics. Will monitor. BP soft but stable. 6. Nausea vomiting-resolved. 7. History hyperlipidemia-hold statin for now 8. History of peripheral neuropathy-stable; continue neurontin 9. Hx of BPH: continue flomax 10. Esophageal varices: per GI rec's will start naldolol; will follow BP closely.  Code Status: Full Family Communication: wife at bedside Disposition Plan: home when medically stable   Brief narrative: 78 yr old male admitted 02/17/2012 with N/V ascites and a history of gastric bypass presented from [facility] with diffuse abdominal extension pain and concern for small bowel obstruction. He has a history of a lap band procedure done in 2005.he on exam was found to be ascitic and had fluid tap 8/9 and gastroenterology was consulted who have been orchestrating his care. He had another ascitic fluid  tap 8/13 this time with biopsy of a heterogeneous liver mass suspected strongly to be some day of biliary carcinoma. Dr. Rhea Belton has discussed with me on multiple occasions his care and his input has been appreciated-currently the patient will continue diuretics, we will await preliminary biopsy results of liver biopsy done 8/13 and patient potentially will be transferred to a tertiary care center after discussion via gastroenterology to surgeons here.    Consultants:  GI  Oncology  [facility] (curbside; Mass is no resectable after they reviewed images)  Procedures: Abdominal x-ray 02/16/2012 = possible developing partial small bowel obstruction, consider CT abdomen pelvis with IV contrast  CT abdomen pelvis 02/16/2012 = liver highly concerning for infiltrative neoplasm and given findings of cirrhosis most concerning for diffusely infiltrative hepatocellular carcinoma, moderate to large volume ascites, cholelithiasis, colonic diverticula  Ultrasound abdomen 89 = abnormal heterogeneous liver with increased echogenicity to anterior right lobe the left lobe concerning for infiltrative tumor process when compared to CT  CT chest 8/9 = no findings suggest metastatic disease to thorax  CT head 8/9 = advanced chronic small vessel disease with old infarcts and right frontal and parietal lobes  Upper endoscopy 8/11 showed grade 2 varices and distal esophagus, total hypertensive gastropathy  Ultrasound paracentesis 8/10 = 2.8 L via the fluid removed with samples sent to lab for analysis  ultrasound paracentesis 8/13 = 4 L tapped-patient had liver biopsy percutaneously is all done up sleep results which are pending  Antibiotics:  None  HPI/Subjective: Feeling a lot better; no CP, no SOB, no N/V. Reports mild abdominal discomfort overnight otherwise no complaints. Afebrile  Objective: Filed Vitals:   02/21/12 1259 02/21/12 2139 02/22/12 0525 02/22/12 1352  BP: 119/72 108/68 105/64 97/62  Pulse: 78 94  92  68  Temp: 98.2 F (36.8 C) 97.9 F (36.6 C) 97.6 F (36.4 C) 98.4 F (36.9 C)  TempSrc:  Oral Oral   Resp: 16 18 18 16   Height:      Weight:      SpO2: 92% 90% 91% 93%    Intake/Output Summary (Last 24 hours) at 02/22/12 1758 Last data filed at 02/22/12 1700  Gross per 24 hour  Intake    840 ml  Output    700 ml  Net    140 ml   Filed Weights   02/17/12 0300 02/20/12 0407 02/21/12 0515  Weight: 98.431 kg (217 lb) 96.3 kg (212 lb 4.9 oz) 94.9 kg (209 lb 3.5 oz)    Exam:   General:  NAD  Cardiovascular: No rubs and no gallops; S1 and S2 appreciated.  Respiratory: CTA  Abdomen: mild distension; soft; positive fluid wave on exam; positive BS  Neurologic: AAOX2; Non focal deficit appreciated; CN grossly intact  Data Reviewed: Basic Metabolic Panel:  Lab 02/20/12 1610 02/17/12 0440 02/16/12 1953  NA 137 136 133*  K 3.7 3.9 4.0  CL 100 101 98  CO2 27 27 25   GLUCOSE 107* 88 70  BUN 17 17 20   CREATININE 1.12 1.03 0.98  CALCIUM 9.2 8.8 9.1  MG -- -- --  PHOS -- -- --   Liver Function Tests:  Lab 02/20/12 0353 02/17/12 1744 02/17/12 0440 02/16/12 1953  AST 157* 160* 150* 180*  ALT 71* 76* 73* 80*  ALKPHOS 333* 341* 320* 345*  BILITOT 1.5* 1.9* 2.2* 1.9*  PROT 6.3 6.4 6.3 7.1  ALBUMIN 2.6* 2.5* 2.5* 2.9*    Lab 02/16/12 1953  LIPASE 89*  AMYLASE --    Lab 02/17/12 1808  AMMONIA 55   CBC:  Lab 02/21/12 0415 02/20/12 0353 02/17/12 0440 02/16/12 1953  WBC 7.8 8.7 6.7 8.1  NEUTROABS -- -- -- 5.4  HGB 13.5 14.4 13.7 15.0  HCT 38.9* 41.9 39.8 43.1  MCV 102.6* 104.0* 103.4* 103.4*  PLT 107* 109* 112* 128*     Recent Results (from the past 240 hour(s))  URINE CULTURE     Status: Normal   Collection Time   02/16/12  7:27 PM      Component Value Range Status Comment   Specimen Description URINE, RANDOM   Final    Special Requests NONE   Final    Culture  Setup Time 02/17/2012 03:42   Final    Colony Count NO GROWTH   Final    Culture NO GROWTH    Final    Report Status 02/17/2012 FINAL   Final   BODY FLUID CULTURE     Status: Normal   Collection Time   02/18/12 11:07 AM      Component Value Range Status Comment   Specimen Description ASCITIC   Final    Special Requests Normal   Final    Gram Stain     Final    Value: NO WBC SEEN     NO ORGANISMS SEEN   Culture NO GROWTH 3 DAYS   Final    Report Status 02/22/2012 FINAL   Final      Studies: Ct Head W Wo Contrast  02/17/2012  *RADIOLOGY REPORT*  Clinical Data: New diagnosis liver cancer.  CT HEAD WITHOUT AND WITH CONTRAST  Technique:  Contiguous axial images were obtained from the base of the skull through the vertex without and with intravenous contrast.  Contrast:  OMNIPAQUE IOHEXOL 300 MG/ML  SOLN  Comparison: 10/23/2010  Findings: Extensive low density throughout the periventricular and deep white matter, likely chronic ischemic changes, stable since prior study.  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  No enhancing lesions.  No acute infarction or hemorrhage.  No hydrocephalus.  No midline shift.  IMPRESSION: Advanced chronic small vessel disease.  Mild atrophy.  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  No acute findings.  Original Report Authenticated By: Cyndie Chime, M.D.   Ct Chest W Contrast  02/17/2012  *RADIOLOGY REPORT*  Clinical Data: Liver tumors.  Evaluate for metastatic disease.  CT CHEST WITH CONTRAST  Technique:  Multidetector CT imaging of the chest was performed following the standard protocol during bolus administration of intravenous contrast.  Contrast: OMNIPAQUE IOHEXOL 300 MG/ML  SOLN  Comparison: CT of the chest 10/23/2010.  Findings:  Mediastinum: Heart size is normal. There is no significant pericardial fluid, thickening or pericardial calcification. There is atherosclerosis of the thoracic aorta, the great vessels of the mediastinum and the coronary arteries, including calcified atherosclerotic plaque in the left main, left  anterior descending, left circumflex and right coronary arteries. Postoperative changes of lap band procedure near the gastroesophageal junction. No pathologically enlarged mediastinal or hilar lymph nodes.  Lungs/Pleura: Subsegmental atelectasis and/or scarring in the left lower lobe.  There are a few tiny subpleural nodules in the posterior aspect of the right lung, several which are calcified; these are tiny and nonspecific, favored to represent either granulomas or subpleural lymph nodes.  No other larger more suspicious appearing pulmonary nodules or masses are otherwise identified to suggest presence of metastatic disease to the lungs on today's examination.  No acute consolidative airspace disease. Trace left pleural effusion.  Upper Abdomen: Please see dictation from CT scan of 02/16/2012 for full description of upper abdominal findings.  Musculoskeletal: There are no aggressive appearing lytic or blastic lesions noted in the visualized portions of the skeleton. Compression fracture of L1 with approximately 50% loss of anterior vertebral body height is unchanged.  IMPRESSION: 1.  No findings to suggest metastatic disease to the thorax on today's examination. 2. Atherosclerosis, including left main and three-vessel coronary artery disease. 3.  Subsegmental atelectasis and/or scarring in the left lower lobe. 4.  Additional incidental findings, as above.  Original Report Authenticated By: Florencia Reasons, M.D.   US Abdomen Complete  02/17/2012  *RADIOLOGY REPORT*  Clinical Data:  Abdominal pain  ABDOMINAL ULTRASOUND COMPLETE  Comparison:  02/2012 CT  Findings:  Gallbladder:  Cholelithiasis noted.  Dominant echogenic shadowing gallstone measures 4.5 cm.  Normal wall thickness measuring 2.3 mm. No Murphy's sign.  No evidence of cholecystitis.  Common Bile Duct:  Within normal limits in caliber.  Liver: Diffusely heterogeneous and echogenic anterior right liver and left hepatic lobe, concerning for underlying  infiltrative tumor process suchas HCC when correlating with the CT findings. Perihepatic ascites noted.  No biliary dilatation.  IVC:  Appears normal.  Pancreas:  No abnormality identified.  Spleen:  Within normal limits in size and echotexture.  Right kidney:  Normal in size and parenchymal echogenicity.  No evidence of mass or hydronephrosis.  Left kidney:  Normal in size and parenchymal echogenicity.  Upper pole lateral hypoechoic cyst is noted with increased through transmission measuring 2 cm.  Abdominal Aorta:  Atherosclerotic changes.  Negative for aneurysm.  IMPRESSION: Abnormal heterogeneous liver with increased echogenicity throughout the anterior right lobe and the left lobe concerning for an  infiltrative tumor process when compared to the CT.  Abdominal ascites  Cholelithiasis but no evidence of wall thickening or Murphy's sign to suggest acute cholecystitis  No biliary dilatation  Left renal cyst  Original Report Authenticated By: Judie Petit. Ruel Favors, M.D.   Ct Abdomen Pelvis W Contrast  02/16/2012  *RADIOLOGY REPORT*  Clinical Data: Abdominal distension and pain.  CT ABDOMEN AND PELVIS WITH CONTRAST  Technique:  Multidetector CT imaging of the abdomen and pelvis was performed following the standard protocol during bolus administration of intravenous contrast.  Contrast: OMNIPAQUE IOHEXOL 300 MG/ML  SOLN  Comparison: No priors.  Findings:  Lung Bases: Scarring or subsegmental atelectasis in the left lower lobe. There is atherosclerosis of the thoracic aorta and the coronary arteries, including calcified atherosclerotic plaque in the the left main, left anterior descending, left circumflex and right coronary arteries.  Abdomen/Pelvis:  There is a diffusely infiltrative mass in the liver involving segments II, III, IVa/IVb, V and VIII. This mass is heterogeneous in appearance with multifocal areas of low attenuation and heterogeneous enhancement.  The liver generally has a slightly shrunken and  nodular contour in the area of the mass, but there appears to be some mild hypertrophy of the right lobe and definite hypertrophy of the caudate lobe.  Numerous gallstones are noted dependently within the lumen of the gallbladder.  The gallbladder wall appears mildly thickened, and there is a small amount of pericholecystic fluid, however, the overall appearance is not strongly suggestive of acute cholecystitis (findings are compounded by the presence of the surrounding ascites).  The appearance of the pancreas, spleen and bilateral adrenal glands is unremarkable.  Mild parenchymal thinning is noted at multiple regions in the kidneys bilaterally, which likely reflects some scarring.  2.2 cm exophytic cyst in the interpolar region of the left kidney.  There is a densely calcified lesion extending off the upper pole of the left kidney.  A nonobstructive 6 mm calculus is noted in the interpolar collecting system of the left kidney.  Moderate - large volume of ascites. Extensive atherosclerosis throughout the abdominal and pelvic vasculature, without definite aneurysm or dissection.  Status post lap band procedure.  Numerous colonic diverticulae.  Given the presence of the ascites, accurate assessment for signs of acute colonic diverticulitis is not possible on today's examination.  Numerous dilated perianal and perirectal veins, compatible with hemorrhoids.  Urinary bladder is unremarkable.  A penile prosthesis is noted with a reservoir in the lower left anatomic pelvis.  Musculoskeletal: Diffuse body wall edema There are no aggressive appearing lytic or blastic lesions noted in the visualized portions of the skeleton.  Compression fracture at L1 with approximately 50% loss of anterior vertebral body height has an appearance suggestive of old injury.  IMPRESSION: 1.  The appearance of the liver is highly concerning for an infiltrative neoplasm, and given and the findings of cirrhosis, is most concerning for a diffusely  infiltrative hepatocellular carcinoma. 2.  Moderate - large volume of ascites. 3.  Cholelithiasis.  Accurate assessment for acute cholecystitis is not possible on this examination given the adjacent ascites. 4.  Colonic diverticulosis.  Accurate assessment for acute diverticulitis is not possible on this examination given the presence of the ascites. 5. Atherosclerosis, including left main and three-vessel coronary artery disease. 6.  Additional incidental findings, as above.  Original Report Authenticated By: Florencia Reasons, M.D.   US Biopsy  02/21/2012  *RADIOLOGY REPORT*  Indication: Infiltrative hepatic mass adjacent to the gallbladder fossa  ULTRASOUND  GUIDED LIVER LESION BIOPSY  Comparison: CT abdomen pelvis - 02/16/2012; abdominal ultrasound - 02/16/2012; ultrasound guided paracentesis - 02/18/2012  Intravenous medications: Fentanyl 100 mcg IV; Versed 1 mg IV  Total Moderate Sedation time: 15 minutes  Complications: None immediate  Procedure:  Informed written consent was obtained from the patient after a discussion of the risks, benefits and alternatives to treatment. The patient understands and consents the procedure.  A timeout was performed prior to the initiation of the procedure.  Ultrasound scanning was performed of the right upper abdominal quadrant demonstrates ill-defined increased heterogeneous echotexture of the gallbladder fossa which correlates with the infiltrating mass seen on preprocedural abdominal CT.  A small amount of ascites remains following recent ultrasound guided paracentesis.  The segment of the infiltrative mass within the medial aspect of the left lobe of the liver adjacent to the gallbladder fossa was selected for biopsy.  The procedure was planned.  The right upper abdominal quadrant was prepped and draped in the usual sterile fashion.  The overlying soft tissues were anesthetized with 1% lidocaine with epinephrine.  A 17 gauge, 6.8 cm co-axial needle was advanced into a  peripheral aspect of the infiltrative mass and 2 fine needle aspirates were obtained with a Francine needle.  Quick stain pathologic review demonstrated scattered atypical cells. This was followed by the acquisition of 3 core biopsies with an 18 gauge core device under direct ultrasound guidance.  As there was a small amount of back bleeding from the coaxial needle, and given the presence of a minimal amount of ascites, the tract was embolized with a small amount of Gelfoam.  The co-axial needle was removed and hemostasis was obtained with manual compression.  Post procedural scanning was negative for definitive area of hemorrhage or additional complication.  A dressing was placed.  The patient tolerated the procedure well without immediate post procedural complication.  Impression:  Technically successful ultrasound guided fine needle aspiration and core needle biopsy of the infiltrative mass about the gallbladder fossa involving the medial segment of the left lobe of the liver  Original Report Authenticated By: Waynard Reeds, M.D.   US Paracentesis  02/18/2012  *RADIOLOGY REPORT*  Clinical Data: Prior history of bladder carcinoma; now with infiltrative liver tumor and ascites; request is made for diagnostic and therapeutic paracentesis.  ULTRASOUND GUIDED DIAGNOSTIC AND THERAPEUTIC  PARACENTESIS  An ultrasound guided paracentesis was thoroughly discussed with the patient and questions answered.  The benefits, risks, alternatives and complications were also discussed.  The patient understands and wishes to proceed with the procedure.  Written consent was obtained.  Ultrasound was performed to localize and mark an adequate pocket of fluid in the right upper to mid quadrant of the abdomen.  The area was then prepped and draped in the normal sterile fashion.  1% Lidocaine was used for local anesthesia.  Under ultrasound guidance a 19 gauge Yueh catheter was introduced.  Paracentesis was performed.  The catheter  was removed and a dressing applied.  Complications:  none  Findings:  A total of approximately 2.8 liters of yellow fluid was removed.  A fluid sample was sent for laboratory analysis.  IMPRESSION: Successful ultrasound guided diagnostic and therapeutic paracentesis yielding 2.8 liters of ascites.  Read by: Jeananne Rama, P.A.-C  Original Report Authenticated By: Waynard Reeds, M.D.   Dg Abd 2 Views  02/16/2012  *RADIOLOGY REPORT*  Clinical Data: Abdominal bloating, history of lap band  ABDOMEN - 2 VIEW  Comparison: Lumbar  spine films of 01/18/2011  Findings: Although the orientation of the lap band is rather vertical, this does not appear to have changed when compared to the prior lumbar spine view from July 2012.  No free air is seen on the erect view.  However, on the supine views there is dilatation of a loop of small bowel in the left abdomen up to 4 cm.  This is worrisome for developing partial small bowel obstruction.  If further assessment is warranted CT of the abdomen pelvis with IV contrast media is recommended.  No colonic distention is seen.  The bones are osteopenic and there are diffuse degenerative changes throughout the lumbar spine.  IMPRESSION:  1. Possible developing partial small bowel obstruction.  Consider CT abdomen and pelvis with IV contrast to assess further. 2.  No free air. 3.  No change in somewhat vertical orientation of the lap band.  Original Report Authenticated By: Juline Patch, M.D.    Scheduled Meds:   . enoxaparin (LOVENOX) injection  40 mg Subcutaneous Q24H  . fluticasone  1 spray Each Nare Daily  . furosemide  80 mg Oral Daily  . gabapentin  600 mg Oral TID  . nadolol  20 mg Oral Daily  . pantoprazole  80 mg Oral Q1200  . spironolactone  200 mg Oral Daily  . Tamsulosin HCl  0.4 mg Oral Daily   Continuous Infusions:   Time spent: >30 minutes    Alvah Gilder  [facility] Pager [phone]. If 8PM-8AM, please contact night-coverage at [email or web address],  password [facility] 02/22/2012, 5:58 PM  LOS: 6 days

## 2012-02-22 NOTE — Progress Notes (Signed)
Patient seen, examined, and I agree with the above documentation, including the assessment and plan. Path has returned and most consistent with cholangiocarcinoma, either primary gallbladder or biliary tree.  If the latter, then it is intrahepatic tumor.  No evidence for biliary obstruction at present.  He also has cirrhosis and ascites.  Ascites likely malignant in nature, rather than purely for portal HTN, therefore it may be more diuretic-resistant.  May need intermittent LVP. For now, proceed to oncology and surgery consultation. Would consider repeat LVP for comfort before d/c home.

## 2012-02-22 NOTE — Progress Notes (Signed)
Spoke with patient family and daughter about diagnosis that MD spoke with wife about. Informed patient that oncology and surgery would see patient; answered questions; will continue to monitor pt.

## 2012-02-22 NOTE — Consult Note (Signed)
Lance Best [id number] Jun 14, 1934 78 y.o. 02/22/2012 9:02 PM   Referring Physician: Dr. Lestine Box  Reason For Consult:  Cholangiocarcinoma.  Findings:  78 year old man admitted for evaluation and management of abdominal distension.  Pt. describes a three month history of progressive pain with emesis.  It was initially felt to be due to gastric banding and despite removal of fluid around the band, hir pain has persisted.  He  Also describes nausea associated with anorexia.    Admitting CT scan of abdomen on 02/16/12 notes a diffusely infiltrative mass in the liver involving segments II, III, IVa/IVb, V and VIII. This mass is heterogeneous in appearance with multifocal areas of low attenuation and heterogeneous enhancement. The liver generally has a slightly shrunken and nodular contour in the area of the mass, but there appears to be some mild hypertrophy of the right lobe and definite hypertrophy of the caudate lobe. Numerous gallstones are noted dependently within the lumen of the gallbladder. The gallbladder wall appears mildly thickened, and there is a small amount of pericholecystic fluid, however, the overall appearance is not strongly suggestive of acute cholecystitis (findings are compounded by the presence of the surrounding ascites). The appearance of the pancreas, spleen and bilateral adrenal glands is unremarkable. Mild parenchymal thinning is noted at multiple regions in the kidneys bilaterally, which likely reflects some scarring. 2.2 cm exophytic cyst in the interpolar region of the left kidney. There is a densely calcified lesion extending off the upper pole of the left kidney. A nonobstructive 6 mm calculus is noted in the interpolar collecting system of the left kidney. Moderate - large volume of ascites. Extensive atherosclerosis throughout the abdominal and pelvic vasculature, without definite aneurysm or dissection. Status post lap band procedure. Numerous colonic diverticulae. Given the  presence of the ascites, accurate assessment for signs of acute colonic diverticulitis is not possible on today's examination. Numerous dilated perianal and perirectal veins, compatible with hemorrhoids. Urinary bladder is unremarkable. A penile prosthesis is noted with a reservoir in the lower left anatomic pelvis. Musculoskeletal: Diffuse body wall edema There are no aggressive appearing lytic or blastic lesions noted in the visualized portions of the skeleton. Compression fracture at L1 with approximately 50% loss of anterior vertebral body height has an appearance suggestive of old injury. IMPRESSION: 1. The appearance of the liver is highly concerning for an infiltrative neoplasm, and given and the findings of cirrhosis, is most concerning for a diffusely infiltrative hepatocellular carcinoma. 2. Moderate - large volume of ascites. 3. Cholelithiasis. Accurate assessment for acute cholecystitis is not possible on this examination given the adjacent ascites. 4. Colonic diverticulosis. Accurate assessment for acute diverticulitis is not possible on this examination given the presence of the ascites. 5. Atherosclerosis, including left main and three-vessel coronary artery disease. 6. Additional incidental findings, as above   Ca 19.9  13.2  CEA 0.7,  Alpha Feto-protein 2.2. Hepatitis serology was negative.  HIV was negative.  EGD notes portal hypertensive gastropathy with varices with underlying cirrhosis.  See results of Chest CT scan below.  He received an ultrasound guided biopsy of mass in left lobe of liver on 02/21/12 and pathology notes an invasive adenocarcinoma felt to be consistent with a cholangiocarcinoma.    He is also s/p paracentesis.  Past Medical History:  Past Medical History  Diagnosis Date  . CVA (cerebral infarction)     memory deficit  . Hx of laparoscopic gastric banding     morbid obesity  . Diabetes mellitus   .  Insomnia   . Allergic rhinitis   . Hyperlipidemia   .  Peripheral neuropathy   . Depression   . Osteoarthritis   . Acid reflux   . Syncope   . Stroke 1998    multiple   . Bladder cancer     inflatable penile implant    Past Surgical History  Procedure Date  . Lap band 2005  . Penile prosthesis implant     due to bladder cancer  . Bladder surgery     for bladder cancer  . Loop recorder implant     in heart to assess cause of syncope   . Esophagogastroduodenoscopy 02/19/2012    Procedure: ESOPHAGOGASTRODUODENOSCOPY (EGD);  Surgeon: Hart Carwin, MD;  Location: [facility];  Service: Endoscopy;  Laterality: N/A;    Allergies:  No Known Allergies  Medications:  Prior to Admission:  Prescriptions prior to admission  Medication Sig Dispense Refill  . esomeprazole (NEXIUM) 40 MG capsule Take 40 mg by mouth daily before breakfast.        . fluticasone (FLONASE) 50 MCG/ACT nasal spray Place 1 spray into the nose daily.       . Folic Acid-Vit B6-Vit B12 (FOLBEE) 2.5-25-1 MG TABS Take 1 tablet by mouth daily.        Marland Kitchen gabapentin (NEURONTIN) 600 MG tablet Take 600 mg by mouth 3 (three) times daily.        . meloxicam (MOBIC) 7.5 MG tablet Take 7.5 mg by mouth 2 (two) times daily.        . Multiple Vitamins-Minerals (CENTRUM SILVER PO) Take 1 tablet by mouth daily.       Marland Kitchen oxyCODONE-acetaminophen (PERCOCET) 10-325 MG per tablet Take 1 tablet by mouth every 4 (four) hours as needed. pain      . Probiotic Product (SOLUBLE FIBER/PROBIOTICS PO) Take 1 tablet by mouth daily.       . Tamsulosin HCl (FLOMAX) 0.4 MG CAPS Take 0.4 mg by mouth daily.       . traMADol (ULTRAM) 50 MG tablet Take 50 mg by mouth every 6 (six) hours as needed. Pain      . VYTORIN 10-20 MG per tablet Take 1 tablet by mouth daily.       Marland Kitchen zolpidem (AMBIEN) 10 MG tablet Take 10 mg by mouth at bedtime as needed. sleep       Scheduled:   . enoxaparin (LOVENOX) injection  40 mg Subcutaneous Q24H  . fluticasone  1 spray Each Nare Daily  . furosemide  80 mg Oral Daily  .  gabapentin  600 mg Oral TID  . nadolol  20 mg Oral Daily  . pantoprazole  80 mg Oral Q1200  . spironolactone  200 mg Oral Daily  . Tamsulosin HCl  0.4 mg Oral Daily   Continuous:   Social History:   reports that he has been smoking.  He does not have any smokeless tobacco history on file. He reports that he drinks alcohol. He reports that he does not use illicit drugs.  Family History:  Family History  Problem Relation Age of Onset  . Hypertension    . Diabetes    . Cancer Sister 3    deceased  . COPD Mother 72    deceased  . Other Mother      breathing problems - emphzma  . Liver disease Sister 74    deceased     Review of Systems: Constitutional ROS: Fever, Chills, Night Sweats, Anorexia, Pain  Cardiovascular ROS: no chest pain or dyspnea on exertion Respiratory ROS: no cough, shortness of breath, or wheezing Neurological ROS: no TIA or stroke symptoms Dermatological ROS: negative ENT ROS: negative Gastrointestinal ROS: Discomfort, swelling with nausea.   Physical Exam: Blood pressure 97/62, pulse 68, temperature 98.4 F (36.9 C), temperature source Oral, resp. rate 16, height 5\' 11"  (1.803 m), weight 209 lb 3.5 oz (94.9 kg), SpO2 93.00%. General appearance: alert and cooperative Resp: clear to auscultation bilaterally and normal percussion bilaterally Cardio: regular rate and rhythm, S1, S2 normal, no murmur, click, rub or gallop GI: soft, non-tender; bowel sounds normal; no masses,  no organomegaly Extremities: extremities normal, atraumatic, no cyanosis or edema   Lab Results: LABS:  CBC    Component Value Date/Time   WBC 7.8 02/21/2012 0415   RBC 3.79* 02/21/2012 0415   HGB 13.5 02/21/2012 0415   HCT 38.9* 02/21/2012 0415   PLT 107* 02/21/2012 0415   MCV 102.6* 02/21/2012 0415   MCH 35.6* 02/21/2012 0415   MCHC 34.7 02/21/2012 0415   RDW 16.0* 02/21/2012 0415   LYMPHSABS 1.7 02/16/2012 1953   MONOABS 1.0 02/16/2012 1953   EOSABS 0.0 02/16/2012 1953   BASOSABS  0.0 02/16/2012 1953     Basename 02/20/12 0353  NA 137  K 3.7  CL 100  CO2 27  GLUCOSE 107*  BUN 17  CREATININE 1.12  CALCIUM 9.2     Radiological Studies:    CT CHEST WITH CONTRAST  Findings:  Mediastinum: Heart size is normal. There is no significant  pericardial fluid, thickening or pericardial calcification. There  is atherosclerosis of the thoracic aorta, the great vessels of the mediastinum and the coronary arteries, including calcified atherosclerotic plaque in the left main, left anterior descending, left circumflex and right coronary arteries. Postoperative changes of lap band procedure near the gastroesophageal junction. No pathologically enlarged mediastinal or hilar lymph nodes.  Lungs/Pleura: Subsegmental atelectasis and/or scarring in the left lower lobe. There are a few tiny subpleural nodules in the posterior aspect of the right lung, several which are calcified; these are tiny and nonspecific, favored to represent either granulomas or subpleural lymph nodes. No other larger more suspicious appearing pulmonary nodules or masses are otherwise identified to suggest presence of metastatic disease to the lungs on today's examination. No acute consolidative airspace disease. Trace left pleural effusion. Upper Abdomen: Please see dictation from CT scan of 02/16/2012 for full description of upper abdominal findings. Musculoskeletal: There are no aggressive appearing lytic or blastic  lesions noted in the visualized portions of the skeleton.  Compression fracture of L1 with approximately 50% loss of anterior  vertebral body height is unchanged.  IMPRESSION:  1. No findings to suggest metastatic disease to the thorax on today's examination.  2. Atherosclerosis, including left main and three-vessel coronary artery disease.  3. Subsegmental atelectasis and/or scarring in the left lower  lobe.  4. Additional incidental findings, as above.  Original Report Authenticated By: Florencia Reasons, M.D.    Impression and Plan: 78 year old woman with a recent diagnosis of cholangiocarcinoma with ascites.  Radiology studies suggest cirrhosis. EGD notes varices with portal hypertensive gastropathy.   Surgeons feel that surgery was not an option and I agree.  Patient's best option at  This point if he agreeable is with palliative systemic chemotherapy (gemcitabine based combination) when medically stable and as an out patient. .   Thank you for this referral.     Ajanee Buren I., MD 02/22/2012

## 2012-02-22 NOTE — Progress Notes (Signed)
[facility] Progress Note  Subjective:  Patient feels ok this AM.  Had a little bit of abdominal pain last night when the medication wore off.  No nausea or vomiting.  Wants to go home.  (Path results were not back when I spoke with the patient earlier today).  Objective:  Vital signs in last 24 hours: Temp:  [97.6 F (36.4 C)-98.2 F (36.8 C)] 97.6 F (36.4 C) (08/14 0525) Pulse Rate:  [75-94] 92  (08/14 0525) Resp:  [11-18] 18  (08/14 0525) BP: (105-135)/(64-72) 105/64 mmHg (08/14 0525) SpO2:  [11 %-97 %] 91 % (08/14 0525) Last BM Date: 02/21/12 General:   Alert,  Well-developed, in NAD Heart:  Regular rate and rhythm; no murmurs Pulm:  CTAB. No W/R/R. Abdomen:  Soft with mild distention from fluid. Normal bowel sounds, without guarding, and without rebound. Nontender to palpation. Lap-band port noted in mid-abdomen near umbilicus.  Extremities:  Without edema. Neurologic:  Alert and  oriented x4;  grossly normal neurologically. Psych:  Alert and cooperative. Normal mood and affect.  Intake/Output from previous day: 08/13 0701 - 08/14 0700 In: 760 [P.O.:760] Out: 150 [Urine:150] Intake/Output this shift: Total I/O In: 240 [P.O.:240] Out: -   Lab Results:  [facility] 02/21/12 0415 02/20/12 0353  WBC 7.8 8.7  HGB 13.5 14.4  HCT 38.9* 41.9  PLT 107* 109*   BMET  Basename 02/20/12 0353  NA 137  K 3.7  CL 100  CO2 27  GLUCOSE 107*  BUN 17  CREATININE 1.12  CALCIUM 9.2   LFT  Basename 02/20/12 0353  PROT 6.3  ALBUMIN 2.6*  AST 157*  ALT 71*  ALKPHOS 333*  BILITOT 1.5*  BILIDIR --  IBILI --   PT/INR  Basename 02/21/12 0415 02/20/12 0353  LABPROT 13.6 14.1  INR 1.02 1.07   Assessment / Plan: 1. Liver mass diffusely infiltrative-path from liver biopsy showing adenocarcinoma.  AFP, CA19-9, and CEA WNL's. 2. Cirrhosis-C/B esophageal varices, portal gastropathy, thrombocytopenia, and ascites; unknown source of liver disease (likely NASH). Viral  hepatitis panel is negative. Is on lasix 80mg  daily and spironolactone to 200mg .  3. Nausea and vomiting-EGD on 02/18/2010 showed no abnormalities to account for these symptoms. Is on protonix 80mg  daily  -Pathology positive for adenocarcinoma; will need oncology and surgical consults.  Would have him seen by these services or at least have outpatient appointments scheduled prior to D/C. -Continue diuretics for fluid management.  Continue PPI. -Continue to monitor electrolytes, LFT's and coags. -Will start low dose of nadolol 20mg  daily to see how he tolerates it with regards to Bp. -Restarted lovenox for now.  Active Problems:  UTI (lower urinary tract infection)  Liver tumor  Vomiting  Hyponatremia  Cholelithiasis without obstruction  Nonspecific (abnormal) findings on radiological and other examination of biliary tract  Nonspecific elevation of levels of transaminase or lactic acid dehydrogenase (LDH)  Esophageal varices   LOS: 6 days   Raechelle Sarti D.  02/22/2012, 9:53 AM

## 2012-02-23 ENCOUNTER — Encounter (INDEPENDENT_AMBULATORY_CARE_PROVIDER_SITE_OTHER): Payer: Medicare Other

## 2012-02-23 DIAGNOSIS — R180 Malignant ascites: Secondary | ICD-10-CM

## 2012-02-23 DIAGNOSIS — R5381 Other malaise: Secondary | ICD-10-CM

## 2012-02-23 LAB — BASIC METABOLIC PANEL
Calcium: 8.5 mg/dL (ref 8.4–10.5)
Creatinine, Ser: 1.5 mg/dL — ABNORMAL HIGH (ref 0.50–1.35)
GFR calc Af Amer: 51 mL/min — ABNORMAL LOW (ref >90)
GFR calc non Af Amer: 44 mL/min — ABNORMAL LOW (ref >90)
Sodium: 133 mEq/L — ABNORMAL LOW (ref 135–145)

## 2012-02-23 LAB — CBC
MCH: 35.7 pg — ABNORMAL HIGH (ref 26.0–34.0)
MCHC: 34.1 g/dL (ref 30.0–36.0)
MCV: 104.8 fL — ABNORMAL HIGH (ref 78.0–100.0)
Platelets: 131 10*3/uL — ABNORMAL LOW (ref 150–400)
RDW: 15.8 % — ABNORMAL HIGH (ref 11.5–15.5)

## 2012-02-23 MED ORDER — OXYCODONE-ACETAMINOPHEN 10-325 MG PO TABS: 1.0000 | ORAL_TABLET | ORAL | Status: DC | PRN

## 2012-02-23 MED ORDER — FUROSEMIDE 80 MG PO TABS: 80.0000 mg | ORAL_TABLET | Freq: Every day | ORAL | Status: DC

## 2012-02-23 MED ORDER — GUAIFENESIN ER 600 MG PO TB12: 1200.0000 mg | ORAL_TABLET | Freq: Two times a day (BID) | ORAL | Status: DC

## 2012-02-23 MED ORDER — PROMETHAZINE HCL 12.5 MG PO TABS: 12.5000 mg | ORAL_TABLET | Freq: Four times a day (QID) | ORAL | Status: DC | PRN

## 2012-02-23 MED ORDER — SPIRONOLACTONE 100 MG PO TABS: 200.0000 mg | ORAL_TABLET | Freq: Every day | ORAL | Status: DC

## 2012-02-23 MED ORDER — NADOLOL 20 MG PO TABS: 20.0000 mg | ORAL_TABLET | Freq: Every day | ORAL | Status: DC

## 2012-02-23 NOTE — Plan of Care (Signed)
Problem: Phase III Progression Outcomes Goal: Discharge plan remains appropriate-arrangements made Outcome: Completed/Met Date Met:  02/23/12 Discharge with home health, equipment.

## 2012-02-23 NOTE — Discharge Summary (Signed)
Physician Discharge Summary  Lance Best ZOX:[id number] DOB: Nov 22, 1933 DOA: 02/16/2012  PCP: Kandyce Rud MD  Admit date: 02/16/2012 Discharge date: 02/23/2012  Recommendations for Outpatient Follow-up:  1. Follow with PCP in 10 days (will be important to review basic metabolic panel in order to follow on patient's electrolytes and renal function now that he is using diuretics and blood pressure medications for his ascites and to decrease portal hypertension; Also follow chronic problems). 2. Follow with oncology service for further evaluation and treatment of your cholangiocarcinoma (in about one week the office is going to call with appointment details) 3. Home health services (PT, OT, RN, health aide) has been arrange in order to continue providing and facilitating the patient's care at home.  Discharge Diagnoses:  Active Problems:  UTI (lower urinary tract infection)  Liver tumor  Vomiting  Hyponatremia  Cholelithiasis without obstruction  Nonspecific (abnormal) findings on radiological and other examination of biliary tract  Nonspecific elevation of levels of transaminase or lactic acid dehydrogenase (LDH)  Esophageal varices  Cholangiocarcinoma   Discharge Condition: Stable and improved, no nausea/vomiting; patient with ascites at discharge but not enough to require another paracentesis at this moment. Patient has been found with cirrhosis, esophageal varices, and acute diagnosis of cholangiocarcinoma (no resectable). Plan is to discharge patient home and follow with oncology in one week in order to establish care at the cancer center for palliative chemotherapy treatment. Patient will also follow with PCP in about 10 days. Due to physical deconditioning home health PT/OT/RN/8 has been arrange; also DME's in order to provide assistance taking care of the patient home has been requested (hospital bed, 3-N- 1 and Bed trapeze)  Diet recommendation: Low sodium, with restricted amount of  protein.  Filed Weights   02/20/12 0407 02/21/12 0515 02/23/12 0736  Weight: 96.3 kg (212 lb 4.9 oz) 94.9 kg (209 lb 3.5 oz) 97.3 kg (214 lb 8.1 oz)    History of present illness:  78 yr old male admitted 02/17/2012 with N/V ascites and a history of gastric bypass presented from [facility] with diffuse abdominal extension pain and concern for small bowel obstruction. He has a history of a lap band procedure done in 2005.he on exam was found to be ascitic and had fluid tap 8/9 and gastroenterology was consulted who have been orchestrating his care. He had another ascitic fluid tap 8/13 this time with biopsy of a heterogeneous liver mass suspected strongly to be some source of biliary carcinoma (pathology report is back and is demonstrating to be cholangiocarcinoma)  Hospital Course:  1. 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 At this point reviewing images with surgery felt to be non-resectable in nature and that patient due to cirrhosis and ascites not a candidate for surgery. Oncology was also consulted and has recommended outpatient followup at the cancer center in order to review and decide on palliative chemotherapy. At this moment patient is in stable condition to be discharged home and continue treatment for his malignant ascites. 2. Ascites secondary to #1-continue diuretics using spironolactone and Lasix. Patient also advised to follow a low-sodium diet and to restrict the amount of proteins of his right lower diet. At this moment he is no short of breath, and even in his belly is distended and with positive wave fluid on exam do not require another paracentesis at this moment; he may ended requiring repetitive low Volume paracentesis as an outpatient. 3. Hyponatremia-resolved with IV fluid repletion, paracentesis and diuretics therapy. Most  likely a combination of intravascular depletion with ongoing fluid overload due to ascites. At discharge sodium 134. 4. Altered  LFTs without encephalopathy-secondary to #1, ammonia 55. Family indicated regarding symptoms of encephalopathy in case he develops any. 5. History syncope followed by electrophysiology-has loop recorder in place, no further symptoms other than mild dizziness after paracentesis, potentially related to mild hypovolemia from paracentesis and diuretics. BP soft but stable. 6. Nausea vomiting-resolved. 7. History hyperlipidemia-hold statin for now; do to abnormal LFTs 8. History of peripheral neuropathy-stable; continue neurontin 9. Hx of BPH: continue flomax 10. Esophageal varices: per GI rec's will start naldolol; will follow BP closely as an outpatient . 11. Physical deconditioning: Home health services has been arrange in order to provide the level of systems that the patient's family need to provide care at home  (PT, OT, RN, health aide; hospital bed, trapeze and 3-N-1 has been requested and arrange for the patient).   Procedures:  Abdominal x-ray 02/16/2012 = possible developing partial small bowel obstruction, consider CT abdomen pelvis with IV contrast  CT abdomen pelvis 02/16/2012 = liver highly concerning for infiltrative neoplasm and given findings of cirrhosis most concerning for diffusely infiltrative hepatocellular carcinoma, moderate to large volume ascites, cholelithiasis, colonic diverticula  Ultrasound abdomen 89 = abnormal heterogeneous liver with increased echogenicity to anterior right lobe the left lobe concerning for infiltrative tumor process when compared to CT  CT chest 8/9 = no findings suggest metastatic disease to thorax  CT head 8/9 = advanced chronic small vessel disease with old infarcts and right frontal and parietal lobes  Upper endoscopy 8/11 showed grade 2 varices and distal esophagus, total hypertensive gastropathy  Ultrasound paracentesis 8/10 = 2.8 L via the fluid removed with samples sent to lab for analysis  ultrasound paracentesis 8/13 = 4 L tapped-patient had  liver biopsy percutaneously (pathology positive for cholangiocarcinoma)   Consultants:  GI  Oncology  [facility] (curbside; Mass is no resectable after they reviewed images)   Discharge Exam: Filed Vitals:   02/23/12 1359  BP: 100/63  Pulse: 74  Temp: 98.1 F (36.7 C)  Resp: 18   Filed Vitals:   02/22/12 1352 02/22/12 2206 02/23/12 0736 02/23/12 1359  BP: 97/62 90/53 105/61 100/63  Pulse: 68 74 70 74  Temp: 98.4 F (36.9 C) 98 F (36.7 C) 98.4 F (36.9 C) 98.1 F (36.7 C)  TempSrc:  Oral Oral Oral  Resp: 16 16 16 18   Height:      Weight:   97.3 kg (214 lb 8.1 oz)   SpO2: 93% 92% 92% 92%    General: In no acute distress; afebrile Cardiovascular: S1 and S2, no rubs or gallops. Respiratory: No wheezing, no crackles; good air movement; scattered rhonchi. Abdomen: Nontender to palpation, positive bowel sounds, mild distention with wave fluid appreciated; no guarding Extremities: No cyanosis or clubbing. Neurologic: Alert, awake and oriented x3, muscle strength 3/5 bilaterally symmetrically, no focal sensory or motor deficit appreciated.  Discharge Instructions  Discharge Orders    Future Appointments: Provider: Department: Dept Phone: Center:   02/27/2012 9:00 AM [facility] [phone] [facility]     Future Orders Please Complete By Expires   Discharge instructions      Comments:   -Follow with oncology as instructed. -Arrange followup with primary care physician in 10 days. -Take medications as prescribed. -Follow a low-sodium diet (1000-1200 mg daily)     Medication List  As of 02/23/2012  2:17 PM   STOP taking these  medications         meloxicam 7.5 MG tablet      VYTORIN 10-20 MG per tablet         TAKE these medications         CENTRUM SILVER PO   Take 1 tablet by mouth daily.      esomeprazole 40 MG capsule   Commonly known as: NEXIUM   Take 40 mg by mouth daily before breakfast.      fluticasone 50 MCG/ACT nasal  spray   Commonly known as: FLONASE   Place 1 spray into the nose daily.      FOLBEE 2.5-25-1 MG Tabs   Generic drug: Folic Acid-Vit B6-Vit B12   Take 1 tablet by mouth daily.      furosemide 80 MG tablet   Commonly known as: LASIX   Take 1 tablet (80 mg total) by mouth daily.      gabapentin 600 MG tablet   Commonly known as: NEURONTIN   Take 600 mg by mouth 3 (three) times daily.      guaiFENesin 600 MG 12 hr tablet   Commonly known as: MUCINEX   Take 2 tablets (1,200 mg total) by mouth 2 (two) times daily.      nadolol 20 MG tablet   Commonly known as: CORGARD   Take 1 tablet (20 mg total) by mouth daily.      oxyCODONE-acetaminophen 10-325 MG per tablet   Commonly known as: PERCOCET   Take 1 tablet by mouth every 4 (four) hours as needed. pain      promethazine 12.5 MG tablet   Commonly known as: PHENERGAN   Take 1 tablet (12.5 mg total) by mouth every 6 (six) hours as needed for nausea.      SOLUBLE FIBER/PROBIOTICS PO   Take 1 tablet by mouth daily.      spironolactone 100 MG tablet   Commonly known as: ALDACTONE   Take 2 tablets (200 mg total) by mouth daily.      Tamsulosin HCl 0.4 MG Caps   Commonly known as: FLOMAX   Take 0.4 mg by mouth daily.      traMADol 50 MG tablet   Commonly known as: ULTRAM   Take 50 mg by mouth every 6 (six) hours as needed. Pain      zolpidem 10 MG tablet   Commonly known as: AMBIEN   Take 10 mg by mouth at bedtime as needed. sleep           Follow-up Information    Follow up with [facility], MD in 10 days.   Contact information:   [address] [place] [address] [phone]       Follow up with ODOGWU,LAURETTA I., MD in 1 week. [facility] office will call you with appointment details and a specific time to be there)    Contact information:   [address] [place] [address] [phone]           The results of significant diagnostics from this hospitalization  (including imaging, microbiology, ancillary and laboratory) are listed below for reference.    Significant Diagnostic Studies: Ct Head W Wo Contrast  02/17/2012  *RADIOLOGY REPORT*  Clinical Data: New diagnosis liver cancer.  CT HEAD WITHOUT AND WITH CONTRAST  Technique:  Contiguous axial images were obtained from the base of the skull through the vertex without and with intravenous contrast.  Contrast: OMNIPAQUE IOHEXOL 300 MG/ML  SOLN  Comparison: 10/23/2010  Findings: Extensive low density throughout the periventricular and deep white matter, likely chronic ischemic changes, stable since prior study.  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  No enhancing lesions.  No acute infarction or hemorrhage.  No hydrocephalus.  No midline shift.  IMPRESSION: Advanced chronic small vessel disease.  Mild atrophy.  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  No acute findings.  Original Report Authenticated By: Cyndie Chime, M.D.   Ct Chest W Contrast  02/17/2012  *RADIOLOGY REPORT*  Clinical Data: Liver tumors.  Evaluate for metastatic disease.  CT CHEST WITH CONTRAST  Technique:  Multidetector CT imaging of the chest was performed following the standard protocol during bolus administration of intravenous contrast.  Contrast: OMNIPAQUE IOHEXOL 300 MG/ML  SOLN  Comparison: CT of the chest 10/23/2010.  Findings:  Mediastinum: Heart size is normal. There is no significant pericardial fluid, thickening or pericardial calcification. There is atherosclerosis of the thoracic aorta, the great vessels of the mediastinum and the coronary arteries, including calcified atherosclerotic plaque in the left main, left anterior descending, left circumflex and right coronary arteries. Postoperative changes of lap band procedure near the gastroesophageal junction. No pathologically enlarged mediastinal or hilar lymph nodes.  Lungs/Pleura: Subsegmental atelectasis and/or scarring in the left lower lobe.   There are a few tiny subpleural nodules in the posterior aspect of the right lung, several which are calcified; these are tiny and nonspecific, favored to represent either granulomas or subpleural lymph nodes.  No other larger more suspicious appearing pulmonary nodules or masses are otherwise identified to suggest presence of metastatic disease to the lungs on today's examination.  No acute consolidative airspace disease. Trace left pleural effusion.  Upper Abdomen: Please see dictation from CT scan of 02/16/2012 for full description of upper abdominal findings.  Musculoskeletal: There are no aggressive appearing lytic or blastic lesions noted in the visualized portions of the skeleton. Compression fracture of L1 with approximately 50% loss of anterior vertebral body height is unchanged.  IMPRESSION: 1.  No findings to suggest metastatic disease to the thorax on today's examination. 2. Atherosclerosis, including left main and three-vessel coronary artery disease. 3.  Subsegmental atelectasis and/or scarring in the left lower lobe. 4.  Additional incidental findings, as above.  Original Report Authenticated By: Florencia Reasons, M.D.   US Abdomen Complete  02/17/2012  *RADIOLOGY REPORT*  Clinical Data:  Abdominal pain  ABDOMINAL ULTRASOUND COMPLETE  Comparison:  02/2012 CT  Findings:  Gallbladder:  Cholelithiasis noted.  Dominant echogenic shadowing gallstone measures 4.5 cm.  Normal wall thickness measuring 2.3 mm. No Murphy's sign.  No evidence of cholecystitis.  Common Bile Duct:  Within normal limits in caliber.  Liver: Diffusely heterogeneous and echogenic anterior right liver and left hepatic lobe, concerning for underlying infiltrative tumor process suchas HCC when correlating with the CT findings. Perihepatic ascites noted.  No biliary dilatation.  IVC:  Appears normal.  Pancreas:  No abnormality identified.  Spleen:  Within normal limits in size and echotexture.  Right kidney:  Normal in size and  parenchymal echogenicity.  No evidence of mass or hydronephrosis.  Left kidney:  Normal in size and parenchymal echogenicity.  Upper pole lateral hypoechoic cyst is noted with increased through transmission measuring 2 cm.  Abdominal Aorta:  Atherosclerotic changes.  Negative for aneurysm.  IMPRESSION: Abnormal heterogeneous liver with increased echogenicity throughout the anterior right lobe and the left lobe concerning for an infiltrative tumor process when compared to the  CT.  Abdominal ascites  Cholelithiasis but no evidence of wall thickening or Murphy's sign to suggest acute cholecystitis  No biliary dilatation  Left renal cyst  Original Report Authenticated By: Judie Petit. Ruel Favors, M.D.   Ct Abdomen Pelvis W Contrast  02/16/2012  *RADIOLOGY REPORT*  Clinical Data: Abdominal distension and pain.  CT ABDOMEN AND PELVIS WITH CONTRAST  Technique:  Multidetector CT imaging of the abdomen and pelvis was performed following the standard protocol during bolus administration of intravenous contrast.  Contrast: OMNIPAQUE IOHEXOL 300 MG/ML  SOLN  Comparison: No priors.  Findings:  Lung Bases: Scarring or subsegmental atelectasis in the left lower lobe. There is atherosclerosis of the thoracic aorta and the coronary arteries, including calcified atherosclerotic plaque in the the left main, left anterior descending, left circumflex and right coronary arteries.  Abdomen/Pelvis:  There is a diffusely infiltrative mass in the liver involving segments II, III, IVa/IVb, V and VIII. This mass is heterogeneous in appearance with multifocal areas of low attenuation and heterogeneous enhancement.  The liver generally has a slightly shrunken and nodular contour in the area of the mass, but there appears to be some mild hypertrophy of the right lobe and definite hypertrophy of the caudate lobe.  Numerous gallstones are noted dependently within the lumen of the gallbladder.  The gallbladder wall appears mildly thickened, and  there is a small amount of pericholecystic fluid, however, the overall appearance is not strongly suggestive of acute cholecystitis (findings are compounded by the presence of the surrounding ascites).  The appearance of the pancreas, spleen and bilateral adrenal glands is unremarkable.  Mild parenchymal thinning is noted at multiple regions in the kidneys bilaterally, which likely reflects some scarring.  2.2 cm exophytic cyst in the interpolar region of the left kidney.  There is a densely calcified lesion extending off the upper pole of the left kidney.  A nonobstructive 6 mm calculus is noted in the interpolar collecting system of the left kidney.  Moderate - large volume of ascites. Extensive atherosclerosis throughout the abdominal and pelvic vasculature, without definite aneurysm or dissection.  Status post lap band procedure.  Numerous colonic diverticulae.  Given the presence of the ascites, accurate assessment for signs of acute colonic diverticulitis is not possible on today's examination.  Numerous dilated perianal and perirectal veins, compatible with hemorrhoids.  Urinary bladder is unremarkable.  A penile prosthesis is noted with a reservoir in the lower left anatomic pelvis.  Musculoskeletal: Diffuse body wall edema There are no aggressive appearing lytic or blastic lesions noted in the visualized portions of the skeleton.  Compression fracture at L1 with approximately 50% loss of anterior vertebral body height has an appearance suggestive of old injury.  IMPRESSION: 1.  The appearance of the liver is highly concerning for an infiltrative neoplasm, and given and the findings of cirrhosis, is most concerning for a diffusely infiltrative hepatocellular carcinoma. 2.  Moderate - large volume of ascites. 3.  Cholelithiasis.  Accurate assessment for acute cholecystitis is not possible on this examination given the adjacent ascites. 4.  Colonic diverticulosis.  Accurate assessment for acute diverticulitis  is not possible on this examination given the presence of the ascites. 5. Atherosclerosis, including left main and three-vessel coronary artery disease. 6.  Additional incidental findings, as above.  Original Report Authenticated By: Florencia Reasons, M.D.   US Biopsy  02/21/2012  *RADIOLOGY REPORT*  Indication: Infiltrative hepatic mass adjacent to the gallbladder fossa  ULTRASOUND GUIDED LIVER LESION BIOPSY  Comparison: CT  abdomen pelvis - 02/16/2012; abdominal ultrasound - 02/16/2012; ultrasound guided paracentesis - 02/18/2012  Intravenous medications: Fentanyl 100 mcg IV; Versed 1 mg IV  Total Moderate Sedation time: 15 minutes  Complications: None immediate  Procedure:  Informed written consent was obtained from the patient after a discussion of the risks, benefits and alternatives to treatment. The patient understands and consents the procedure.  A timeout was performed prior to the initiation of the procedure.  Ultrasound scanning was performed of the right upper abdominal quadrant demonstrates ill-defined increased heterogeneous echotexture of the gallbladder fossa which correlates with the infiltrating mass seen on preprocedural abdominal CT.  A small amount of ascites remains following recent ultrasound guided paracentesis.  The segment of the infiltrative mass within the medial aspect of the left lobe of the liver adjacent to the gallbladder fossa was selected for biopsy.  The procedure was planned.  The right upper abdominal quadrant was prepped and draped in the usual sterile fashion.  The overlying soft tissues were anesthetized with 1% lidocaine with epinephrine.  A 17 gauge, 6.8 cm co-axial needle was advanced into a peripheral aspect of the infiltrative mass and 2 fine needle aspirates were obtained with a Francine needle.  Quick stain pathologic review demonstrated scattered atypical cells. This was followed by the acquisition of 3 core biopsies with an 18 gauge core device under direct  ultrasound guidance.  As there was a small amount of back bleeding from the coaxial needle, and given the presence of a minimal amount of ascites, the tract was embolized with a small amount of Gelfoam.  The co-axial needle was removed and hemostasis was obtained with manual compression.  Post procedural scanning was negative for definitive area of hemorrhage or additional complication.  A dressing was placed.  The patient tolerated the procedure well without immediate post procedural complication.  Impression:  Technically successful ultrasound guided fine needle aspiration and core needle biopsy of the infiltrative mass about the gallbladder fossa involving the medial segment of the left lobe of the liver  Original Report Authenticated By: Waynard Reeds, M.D.   US Paracentesis  02/18/2012  *RADIOLOGY REPORT*  Clinical Data: Prior history of bladder carcinoma; now with infiltrative liver tumor and ascites; request is made for diagnostic and therapeutic paracentesis.  ULTRASOUND GUIDED DIAGNOSTIC AND THERAPEUTIC  PARACENTESIS  An ultrasound guided paracentesis was thoroughly discussed with the patient and questions answered.  The benefits, risks, alternatives and complications were also discussed.  The patient understands and wishes to proceed with the procedure.  Written consent was obtained.  Ultrasound was performed to localize and mark an adequate pocket of fluid in the right upper to mid quadrant of the abdomen.  The area was then prepped and draped in the normal sterile fashion.  1% Lidocaine was used for local anesthesia.  Under ultrasound guidance a 19 gauge Yueh catheter was introduced.  Paracentesis was performed.  The catheter was removed and a dressing applied.  Complications:  none  Findings:  A total of approximately 2.8 liters of yellow fluid was removed.  A fluid sample was sent for laboratory analysis.  IMPRESSION: Successful ultrasound guided diagnostic and therapeutic paracentesis yielding  2.8 liters of ascites.  Read by: Jeananne Rama, P.A.-C  Original Report Authenticated By: Waynard Reeds, M.D.   Dg Abd 2 Views  02/16/2012  *RADIOLOGY REPORT*  Clinical Data: Abdominal bloating, history of lap band  ABDOMEN - 2 VIEW  Comparison: Lumbar spine films of 01/18/2011  Findings: Although  the orientation of the lap band is rather vertical, this does not appear to have changed when compared to the prior lumbar spine view from July 2012.  No free air is seen on the erect view.  However, on the supine views there is dilatation of a loop of small bowel in the left abdomen up to 4 cm.  This is worrisome for developing partial small bowel obstruction.  If further assessment is warranted CT of the abdomen pelvis with IV contrast media is recommended.  No colonic distention is seen.  The bones are osteopenic and there are diffuse degenerative changes throughout the lumbar spine.  IMPRESSION:  1. Possible developing partial small bowel obstruction.  Consider CT abdomen and pelvis with IV contrast to assess further. 2.  No free air. 3.  No change in somewhat vertical orientation of the lap band.  Original Report Authenticated By: Juline Patch, M.D.    Microbiology: Recent Results (from the past 240 hour(s))  URINE CULTURE     Status: Normal   Collection Time   02/16/12  7:27 PM      Component Value Range Status Comment   Specimen Description URINE, RANDOM   Final    Special Requests NONE   Final    Culture  Setup Time 02/17/2012 03:42   Final    Colony Count NO GROWTH   Final    Culture NO GROWTH   Final    Report Status 02/17/2012 FINAL   Final   BODY FLUID CULTURE     Status: Normal   Collection Time   02/18/12 11:07 AM      Component Value Range Status Comment   Specimen Description ASCITIC   Final    Special Requests Normal   Final    Gram Stain     Final    Value: NO WBC SEEN     NO ORGANISMS SEEN   Culture NO GROWTH 3 DAYS   Final    Report Status 02/22/2012 FINAL   Final       Labs: Basic Metabolic Panel:  Lab 02/23/12 0981 02/20/12 0353 02/17/12 0440 02/16/12 1953  NA 133* 137 136 133*  K 3.9 3.7 3.9 4.0  CL 99 100 101 98  CO2 26 27 27 25   GLUCOSE 124* 107* 88 70  BUN 32* 17 17 20   CREATININE 1.50* 1.12 1.03 0.98  CALCIUM 8.5 9.2 8.8 9.1  MG -- -- -- --  PHOS -- -- -- --   Liver Function Tests:  Lab 02/20/12 0353 02/17/12 1744 02/17/12 0440 02/16/12 1953  AST 157* 160* 150* 180*  ALT 71* 76* 73* 80*  ALKPHOS 333* 341* 320* 345*  BILITOT 1.5* 1.9* 2.2* 1.9*  PROT 6.3 6.4 6.3 7.1  ALBUMIN 2.6* 2.5* 2.5* 2.9*    Lab 02/16/12 1953  LIPASE 89*  AMYLASE --    Lab 02/17/12 1808  AMMONIA 55   CBC:  Lab 02/23/12 0326 02/21/12 0415 02/20/12 0353 02/17/12 0440 02/16/12 1953  WBC 9.2 7.8 8.7 6.7 8.1  NEUTROABS -- -- -- -- 5.4  HGB 13.5 13.5 14.4 13.7 15.0  HCT 39.6 38.9* 41.9 39.8 43.1  MCV 104.8* 102.6* 104.0* 103.4* 103.4*  PLT 131* 107* 109* 112* 128*    Time coordinating discharge: More than 30 minutes  Signed:  Cierrah Dace  [facility] 02/23/2012, 2:17 PM

## 2012-02-24 ENCOUNTER — Telehealth: Payer: Self-pay | Admitting: Hematology and Oncology

## 2012-02-24 NOTE — Telephone Encounter (Signed)
S/W pt spouse re hosp f/u 8/21 @ 3 w/ LO.

## 2012-02-24 NOTE — Telephone Encounter (Signed)
Hosp F/U. NP packet mailed out.

## 2012-02-26 ENCOUNTER — Inpatient Hospital Stay (HOSPITAL_COMMUNITY)
Admission: EM | Admit: 2012-02-26 | Discharge: 2012-02-28 | DRG: 441 | Disposition: A | Discharge status: Hospice | Payer: Medicare Other | Attending: Internal Medicine | Admitting: Internal Medicine

## 2012-02-26 ENCOUNTER — Encounter (HOSPITAL_COMMUNITY): Payer: Self-pay | Admitting: *Deleted

## 2012-02-26 ENCOUNTER — Inpatient Hospital Stay (HOSPITAL_COMMUNITY): Payer: Medicare Other

## 2012-02-26 DIAGNOSIS — Z515 Encounter for palliative care: Secondary | ICD-10-CM

## 2012-02-26 DIAGNOSIS — R627 Adult failure to thrive: Secondary | ICD-10-CM | POA: Diagnosis present

## 2012-02-26 DIAGNOSIS — K7682 Hepatic encephalopathy: Principal | ICD-10-CM | POA: Diagnosis present

## 2012-02-26 DIAGNOSIS — K859 Acute pancreatitis without necrosis or infection, unspecified: Secondary | ICD-10-CM | POA: Diagnosis present

## 2012-02-26 DIAGNOSIS — Z9884 Bariatric surgery status: Secondary | ICD-10-CM

## 2012-02-26 DIAGNOSIS — R4182 Altered mental status, unspecified: Secondary | ICD-10-CM

## 2012-02-26 DIAGNOSIS — Z79899 Other long term (current) drug therapy: Secondary | ICD-10-CM

## 2012-02-26 DIAGNOSIS — E1142 Type 2 diabetes mellitus with diabetic polyneuropathy: Secondary | ICD-10-CM | POA: Diagnosis present

## 2012-02-26 DIAGNOSIS — R109 Unspecified abdominal pain: Secondary | ICD-10-CM

## 2012-02-26 DIAGNOSIS — Z8551 Personal history of malignant neoplasm of bladder: Secondary | ICD-10-CM

## 2012-02-26 DIAGNOSIS — R1310 Dysphagia, unspecified: Secondary | ICD-10-CM | POA: Diagnosis present

## 2012-02-26 DIAGNOSIS — C228 Malignant neoplasm of liver, primary, unspecified as to type: Secondary | ICD-10-CM | POA: Diagnosis present

## 2012-02-26 DIAGNOSIS — K7290 Hepatic failure, unspecified without coma: Principal | ICD-10-CM | POA: Diagnosis present

## 2012-02-26 DIAGNOSIS — R5381 Other malaise: Secondary | ICD-10-CM | POA: Diagnosis present

## 2012-02-26 DIAGNOSIS — F17200 Nicotine dependence, unspecified, uncomplicated: Secondary | ICD-10-CM | POA: Diagnosis present

## 2012-02-26 DIAGNOSIS — C221 Intrahepatic bile duct carcinoma: Secondary | ICD-10-CM

## 2012-02-26 DIAGNOSIS — E785 Hyperlipidemia, unspecified: Secondary | ICD-10-CM | POA: Diagnosis present

## 2012-02-26 DIAGNOSIS — R188 Other ascites: Secondary | ICD-10-CM | POA: Diagnosis present

## 2012-02-26 DIAGNOSIS — Z8673 Personal history of transient ischemic attack (TIA), and cerebral infarction without residual deficits: Secondary | ICD-10-CM

## 2012-02-26 DIAGNOSIS — Z66 Do not resuscitate: Secondary | ICD-10-CM | POA: Diagnosis present

## 2012-02-26 DIAGNOSIS — K219 Gastro-esophageal reflux disease without esophagitis: Secondary | ICD-10-CM | POA: Diagnosis present

## 2012-02-26 DIAGNOSIS — E1149 Type 2 diabetes mellitus with other diabetic neurological complication: Secondary | ICD-10-CM | POA: Diagnosis present

## 2012-02-26 DIAGNOSIS — R0600 Dyspnea, unspecified: Secondary | ICD-10-CM

## 2012-02-26 DIAGNOSIS — R531 Weakness: Secondary | ICD-10-CM

## 2012-02-26 HISTORY — DX: Intrahepatic bile duct carcinoma: C22.1

## 2012-02-26 LAB — COMPREHENSIVE METABOLIC PANEL
ALT: 98 U/L — ABNORMAL HIGH (ref 0–53)
Albumin: 2.6 g/dL — ABNORMAL LOW (ref 3.5–5.2)
Alkaline Phosphatase: 298 U/L — ABNORMAL HIGH (ref 39–117)
BUN: 48 mg/dL — ABNORMAL HIGH (ref 6–23)
Chloride: 102 mEq/L (ref 96–112)
Potassium: 4.1 mEq/L (ref 3.5–5.1)
Total Bilirubin: 2.1 mg/dL — ABNORMAL HIGH (ref 0.3–1.2)

## 2012-02-26 LAB — CBC WITH DIFFERENTIAL/PLATELET
Basophils Relative: 0 % (ref 0–1)
Hemoglobin: 15.2 g/dL (ref 13.0–17.0)
Lymphs Abs: 1.9 10*3/uL (ref 0.7–4.0)
MCHC: 35.1 g/dL (ref 30.0–36.0)
Monocytes Relative: 14 % — ABNORMAL HIGH (ref 3–12)
Neutro Abs: 6 10*3/uL (ref 1.7–7.7)
Neutrophils Relative %: 65 % (ref 43–77)
RBC: 4.13 MIL/uL — ABNORMAL LOW (ref 4.22–5.81)

## 2012-02-26 LAB — URINALYSIS, ROUTINE W REFLEX MICROSCOPIC
Leukocytes, UA: NEGATIVE
Protein, ur: NEGATIVE mg/dL
Specific Gravity, Urine: 1.013 (ref 1.005–1.030)
Urobilinogen, UA: 1 mg/dL (ref 0.0–1.0)

## 2012-02-26 LAB — AMMONIA: Ammonia: 95 umol/L — ABNORMAL HIGH (ref 11–60)

## 2012-02-26 LAB — LIPASE, BLOOD: Lipase: 220 U/L — ABNORMAL HIGH (ref 11–59)

## 2012-02-26 MED ORDER — ONDANSETRON HCL 4 MG/2ML IJ SOLN
4.0000 mg | Freq: Once | INTRAMUSCULAR | Status: AC
  Administered 2012-02-26: 4 mg via INTRAVENOUS
  Filled 2012-02-26: qty 2

## 2012-02-26 MED ORDER — SODIUM CHLORIDE 0.9 % IV SOLN: INTRAVENOUS | Status: DC

## 2012-02-26 MED ORDER — MORPHINE SULFATE 4 MG/ML IJ SOLN
4.0000 mg | Freq: Once | INTRAMUSCULAR | Status: AC
  Administered 2012-02-26: 4 mg via INTRAVENOUS
  Filled 2012-02-26: qty 1

## 2012-02-26 MED ORDER — LACTULOSE 10 GM/15ML PO SOLN
30.0000 g | Freq: Once | ORAL | Status: AC
  Administered 2012-02-26: 30 g via ORAL
  Filled 2012-02-26: qty 45

## 2012-02-26 NOTE — ED Notes (Signed)
RN to obtain labs with start of IV 

## 2012-02-26 NOTE — ED Notes (Signed)
ZOX:[id number]<AV> Expected date:02/26/12<BR> Expected time: 7:18 PM<BR> Means of arrival:Ambulance<BR> Comments:<BR> lethargic

## 2012-02-26 NOTE — ED Provider Notes (Signed)
History     CSN: [id number]  Arrival date & time 02/26/12  1927   First MD Initiated Contact with Patient 02/26/12 2010      Chief Complaint  Patient presents with  . Altered Mental Status   HPI  History provided by the patient and family. Patient is a 78 year old male with prior history of hypertension, hyperlipidemia, diabetes, past bladder cancer, recent diagnosis of liver cancer who presents with concerns for increasing abdominal distention and alternate status. Family reports that patient was just seen and evaluated in the hospital the past several days. He was discharged 3 days ago. During that time he was diagnosed with liver cancer with plans to followup with oncology this Tuesday. He was also treated for UTI during his hospital stay. Over the past 3 days family has noticed slightly increased confusion and patient has also complained of increasing abdominal discomforts. There been no reports of fever at home. No episodes of nausea or vomiting. No complaints of diarrhea or constipation. Patient denies any dysuria, hematuria or urinary frequency.    Past Medical History  Diagnosis Date  . CVA (cerebral infarction)     memory deficit  . Hx of laparoscopic gastric banding     morbid obesity  . Diabetes mellitus   . Insomnia   . Allergic rhinitis   . Hyperlipidemia   . Peripheral neuropathy   . Depression   . Osteoarthritis   . Acid reflux   . Syncope   . Stroke 1998    multiple   . Bladder cancer     inflatable penile implant    Past Surgical History  Procedure Date  . Lap band 2005  . Penile prosthesis implant     due to bladder cancer  . Bladder surgery     for bladder cancer  . Loop recorder implant     in heart to assess cause of syncope   . Esophagogastroduodenoscopy 02/19/2012    Procedure: ESOPHAGOGASTRODUODENOSCOPY (EGD);  Surgeon: Hart Carwin, MD;  Location: [facility];  Service: Endoscopy;  Laterality: N/A;  . Abdominal surgery   . Penile prosthesis  implant   . Bladder cancer 2004  . Loop recorder 2013    Family History  Problem Relation Age of Onset  . Hypertension    . Diabetes    . Cancer Sister 45    deceased  . COPD Mother 81    deceased  . Other Mother      breathing problems - emphzma  . Liver disease Sister 21    deceased    History  Substance Use Topics  . Smoking status: Current Everyday Smoker -- 1.0 packs/day  . Smokeless tobacco: Not on file  . Alcohol Use: Yes     once in a while - socially      Review of Systems  Constitutional: Negative for fever and chills.  Respiratory: Negative for cough.   Gastrointestinal: Positive for abdominal pain and abdominal distention. Negative for nausea, vomiting, diarrhea and constipation.  Genitourinary: Negative for dysuria, frequency and hematuria.  Skin: Negative for rash.  Psychiatric/Behavioral: Positive for confusion.    Allergies  Review of patient's allergies indicates no known allergies.  Home Medications   Current Outpatient Rx  Name Route Sig Dispense Refill  . ESOMEPRAZOLE MAGNESIUM 40 MG PO CPDR Oral Take 40 mg by mouth daily before breakfast.      . FLUTICASONE PROPIONATE 50 MCG/ACT NA SUSP Nasal Place 1 spray into the nose daily.     Marland Kitchen  FUROSEMIDE 80 MG PO TABS Oral Take 80 mg by mouth daily.    Marland Kitchen GABAPENTIN 600 MG PO TABS Oral Take 600 mg by mouth 3 (three) times daily.      . GUAIFENESIN 100 MG/5ML PO LIQD Oral Take 200 mg by mouth 3 (three) times daily as needed. CONGESTION    . CENTRUM SILVER PO Oral Take 1 tablet by mouth daily.     Marland Kitchen NADOLOL 20 MG PO TABS Oral Take 20 mg by mouth daily.    . OXYCODONE-ACETAMINOPHEN 10-325 MG PO TABS Oral Take 1 tablet by mouth every 4 (four) hours as needed. pain    . SOLUBLE FIBER/PROBIOTICS PO Oral Take 1 tablet by mouth daily.     Marland Kitchen PROMETHAZINE HCL 12.5 MG PO TABS Oral Take 12.5 mg by mouth every 6 (six) hours as needed.    Marland Kitchen SPIRONOLACTONE 100 MG PO TABS Oral Take 200 mg by mouth daily.    Marland Kitchen  TAMSULOSIN HCL 0.4 MG PO CAPS Oral Take 0.4 mg by mouth daily.     . TRAMADOL HCL 50 MG PO TABS Oral Take 50 mg by mouth every 6 (six) hours as needed. Pain    . ZOLPIDEM TARTRATE 10 MG PO TABS Oral Take 10 mg by mouth at bedtime as needed. sleep      BP 116/72  Temp 97.2 F (36.2 C) (Oral)  Resp 20  SpO2 96%  Physical Exam  Nursing note and vitals reviewed. Constitutional: He appears well-developed and well-nourished. No distress.  HENT:  Head: Normocephalic.  Eyes: Conjunctivae and EOM are normal.       Pinpoint pupils. Equal bilaterally  Cardiovascular: Normal rate and regular rhythm.   No murmur heard. Pulmonary/Chest: Effort normal and breath sounds normal. No respiratory distress. He has no wheezes. He has no rales.       Pacemaker in left chest  Abdominal: Soft. He exhibits distension. There is tenderness. There is no rebound and no guarding.       Ascites present. Diffuse mild tenderness.  Small puncture wounds to right lateral abdomen from recent procedure with no bleeding or drainage. Skin normal. There is a firm mass in the left abdomen near the periumbilical area consistent with history of lap band access port.  Musculoskeletal: He exhibits no edema and no tenderness.  Neurological: He is alert.       Patient is unsure of the month or day. He has some slow responses to other questions but recognizes self, family and year.  Skin: Skin is warm.  Psychiatric: He has a normal mood and affect. His behavior is normal.    ED Course  Procedures   Results for orders placed during the hospital encounter of 02/26/12  AMMONIA      Component Value Range   Ammonia 95 (*) 11 - 60 umol/L  CBC WITH DIFFERENTIAL      Component Value Range   WBC 9.3  4.0 - 10.5 K/uL   RBC 4.13 (*) 4.22 - 5.81 MIL/uL   Hemoglobin 15.2  13.0 - 17.0 g/dL   HCT 16.1  09.6 - 04.5 %   MCV 104.8 (*) 78.0 - 100.0 fL   MCH 36.8 (*) 26.0 - 34.0 pg   MCHC 35.1  30.0 - 36.0 g/dL   RDW 40.9 (*) 81.1 -  15.5 %   Platelets 152  150 - 400 K/uL   Neutrophils Relative 65  43 - 77 %   Neutro Abs 6.0  1.7 - 7.7  K/uL   Lymphocytes Relative 20  12 - 46 %   Lymphs Abs 1.9  0.7 - 4.0 K/uL   Monocytes Relative 14 (*) 3 - 12 %   Monocytes Absolute 1.3 (*) 0.1 - 1.0 K/uL   Eosinophils Relative 1  0 - 5 %   Eosinophils Absolute 0.1  0.0 - 0.7 K/uL   Basophils Relative 0  0 - 1 %   Basophils Absolute 0.0  0.0 - 0.1 K/uL  COMPREHENSIVE METABOLIC PANEL      Component Value Range   Sodium 137  135 - 145 mEq/L   Potassium 4.1  3.5 - 5.1 mEq/L   Chloride 102  96 - 112 mEq/L   CO2 26  19 - 32 mEq/L   Glucose, Bld 88  70 - 99 mg/dL   BUN 48 (*) 6 - 23 mg/dL   Creatinine, Ser 2.95 (*) 0.50 - 1.35 mg/dL   Calcium 9.3  8.4 - 62.1 mg/dL   Total Protein 6.8  6.0 - 8.3 g/dL   Albumin 2.6 (*) 3.5 - 5.2 g/dL   AST 308 (*) 0 - 37 U/L   ALT 98 (*) 0 - 53 U/L   Alkaline Phosphatase 298 (*) 39 - 117 U/L   Total Bilirubin 2.1 (*) 0.3 - 1.2 mg/dL   GFR calc non Af Amer 46 (*) >90 mL/min   GFR calc Af Amer 54 (*) >90 mL/min  LIPASE, BLOOD      Component Value Range   Lipase 220 (*) 11 - 59 U/L  URINALYSIS, ROUTINE W REFLEX MICROSCOPIC      Component Value Range   Color, Urine YELLOW  YELLOW   APPearance CLEAR  CLEAR   Specific Gravity, Urine 1.013  1.005 - 1.030   pH 5.5  5.0 - 8.0   Glucose, UA NEGATIVE  NEGATIVE mg/dL   Hgb urine dipstick NEGATIVE  NEGATIVE   Bilirubin Urine NEGATIVE  NEGATIVE   Ketones, ur NEGATIVE  NEGATIVE mg/dL   Protein, ur NEGATIVE  NEGATIVE mg/dL   Urobilinogen, UA 1.0  0.0 - 1.0 mg/dL   Nitrite NEGATIVE  NEGATIVE   Leukocytes, UA NEGATIVE  NEGATIVE       1. Hepatic encephalopathy       MDM  8:10PM patient seen and evaluated. Patient with recent diagnosis of liver cancer. Released from hospital 3 days ago. Followup with oncology planned for Tuesday. Patient and family are to decide treatment choices. Currently family states patient does not wish to have treatment and  would also like to be DO NOT RESUSCITATE but no formal paperwork has been filed. Wife is power of attorney and states she is saddened by patient's choice.  She would like him to be made comfortable here.    Pt discussed with Attending Physician.  Will consult hospitalist.  Spoke with Dr. Selena Batten with [facility]. He will see patient and admit to telemetry bed under team 8. He would like a CT scan ordered to further evaluate elevated lipase possible pancreatitis.      Angus Seller, [place] 02/26/12 [phone]

## 2012-02-26 NOTE — ED Notes (Signed)
Per EMS New onset and diagnosis of liver cancer, Stg I cancer. Pt wife notice AMS at 1500 when giving pain medication. Pt wife also noticed more ascites today in abdomen.   VS:   P:66  RR:14-16  BP:122 palpated  Spo2-95 % 4L  CBG: 78

## 2012-02-26 NOTE — H&P (Signed)
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  Pt is having difficulty walking to the bathroom due to weakness. Pt has made determination per his wife and he agrees that he doesn't want therapy for his cholangiocarcinoma.  Pt has had abdominal pain this afternoon intermittently.  Denies fever, chills, cp, palp, sob, n/v, constipation, brbpr, black stool.   Last bm yesterday afternoon.  Loose stool.  Pt will be admitted for FTT, generalized weakness, altered ms.   Review of Systems:  Negative for all 10 organ systems except for + above  Past Medical History  Diagnosis Date  . CVA (cerebral infarction)     memory deficit  . Hx of laparoscopic gastric banding     morbid obesity  . Diabetes mellitus   . Insomnia   . Allergic rhinitis   . Hyperlipidemia   . Peripheral neuropathy   . Depression   . Osteoarthritis   . Acid reflux   . Syncope   . Stroke 1998    multiple   . Bladder cancer     inflatable penile implant  . Cholangiocarcinoma 2013   Past Surgical History  Procedure Date  . Lap band 2005  . Penile prosthesis implant     due to bladder cancer  . Bladder surgery     for bladder cancer  . Loop recorder implant     in heart to assess cause of syncope   . Esophagogastroduodenoscopy 02/19/2012    Procedure: ESOPHAGOGASTRODUODENOSCOPY (EGD);  Surgeon: Hart Carwin, MD;  Location: [facility];  Service: Endoscopy;  Laterality: N/A;  . Abdominal surgery   . Penile prosthesis implant   . Bladder cancer 2004  . Loop recorder 2013   Social History:  reports that he has been smoking.  He does not have any smokeless tobacco history on file. He reports that he  does not drink alcohol or use illicit drugs. Lives at home unable to perform adls  No Known Allergies  Family History  Problem Relation Age of Onset  . Hypertension    . Diabetes    . Cancer Sister 43    deceased  . COPD Mother 36    deceased  . Other Mother      breathing problems - emphzma  . Liver disease Sister 35    deceased    (be sure to complete)  Prior to Admission medications   Medication Sig Start Date End Date Taking? Authorizing Provider  esomeprazole (NEXIUM) 40 MG capsule Take 40 mg by mouth daily before breakfast.     Yes Historical Provider, MD  fluticasone (FLONASE) 50 MCG/ACT nasal spray Place 1 spray into the nose daily.  01/04/12  Yes Historical Provider, MD  furosemide (LASIX) 80 MG tablet Take 80 mg by mouth daily. 02/23/12 02/22/13 Yes Vassie Loll, MD  gabapentin (NEURONTIN) 600 MG tablet Take 600 mg by mouth 3 (three) times daily.     Yes Historical Provider, MD  guaiFENesin (ROBITUSSIN) 100 MG/5ML liquid Take 200 mg by mouth 3 (three) times daily as needed. CONGESTION   Yes Historical Provider, MD  Multiple Vitamins-Minerals (CENTRUM SILVER PO) Take 1 tablet by mouth daily.    Yes Historical Provider, MD  nadolol (CORGARD) 20 MG  tablet Take 20 mg by mouth daily. 02/23/12 02/22/13 Yes Vassie Loll, MD  oxyCODONE-acetaminophen (PERCOCET) 10-325 MG per tablet Take 1 tablet by mouth every 4 (four) hours as needed. pain 02/23/12  Yes Vassie Loll, MD  Probiotic Product (SOLUBLE FIBER/PROBIOTICS PO) Take 1 tablet by mouth daily.    Yes Historical Provider, MD  promethazine (PHENERGAN) 12.5 MG tablet Take 12.5 mg by mouth every 6 (six) hours as needed. 02/23/12 03/01/12 Yes Vassie Loll, MD  spironolactone (ALDACTONE) 100 MG tablet Take 200 mg by mouth daily. 02/23/12 02/22/13 Yes Vassie Loll, MD  Tamsulosin HCl (FLOMAX) 0.4 MG CAPS Take 0.4 mg by mouth daily.  12/08/11  Yes Historical Provider, MD  traMADol (ULTRAM) 50 MG tablet Take 50 mg by mouth every 6 (six) hours  as needed. Pain   Yes Historical Provider, MD  zolpidem (AMBIEN) 10 MG tablet Take 10 mg by mouth at bedtime as needed. sleep   Yes Historical Provider, MD   Physical Exam: Filed Vitals:   02/26/12 1935  BP: 116/72  Temp: 97.2 F (36.2 C)  TempSrc: Oral  Resp: 20  SpO2: 96%     General:  Frail looking elderly male  Eyes: anicteric  ENT: mm dry,   Neck: no jvd, no bruit, no tm  Cardiovascular: rrr s1, s2  Respiratory: ctab  Abdomen: soft, distended, nt, +bs  Skin: no rash, no c/c/e  Musculoskeletal: wnl  Psychiatric: axox2 (person and place, not time)  Neurologic: nonfocal,  cn2-12 intact, reflexes 2+ symmetric diffuse with downgoing toes bil, motoro 5/5 in all 4 ext  Labs on Admission:  Basic Metabolic Panel:  Lab 02/26/12 1610 02/23/12 0326 02/20/12 0353  NA 137 133* 137  K 4.1 3.9 3.7  CL 102 99 100  CO2 26 26 27   GLUCOSE 88 124* 107*  BUN 48* 32* 17  CREATININE 1.43* 1.50* 1.12  CALCIUM 9.3 8.5 9.2  MG -- -- --  PHOS -- -- --   Liver Function Tests:  Lab 02/26/12 2045 02/20/12 0353  AST 140* 157*  ALT 98* 71*  ALKPHOS 298* 333*  BILITOT 2.1* 1.5*  PROT 6.8 6.3  ALBUMIN 2.6* 2.6*    Lab 02/26/12 2045  LIPASE 220*  AMYLASE --    Lab 02/26/12 2045  AMMONIA 95*   CBC:  Lab 02/26/12 2045 02/23/12 0326 02/21/12 0415 02/20/12 0353  WBC 9.3 9.2 7.8 8.7  NEUTROABS 6.0 -- -- --  HGB 15.2 13.5 13.5 14.4  HCT 43.3 39.6 38.9* 41.9  MCV 104.8* 104.8* 102.6* 104.0*  PLT 152 131* 107* 109*   Cardiac Enzymes: No results found for this basename: CKTOTAL:5,CKMB:5,CKMBINDEX:5,TROPONINI:5 in the last 168 hours  BNP (last 3 results) No results found for this basename: PROBNP:3 in the last 8760 hours CBG: No results found for this basename: GLUCAP:5 in the last 168 hours  Radiological Exams on Admission: No results found.  EKG:  Assessment/Plan Active Problems:  Altered mental status   1. Altered ms:  Likely secondary to hepatic  encephalopathy,  Lactulose, recheck ammonia, check b12, folate, tsh, ana , esr, rpr,  Check CT brain noncontrast.  2. Cholangiocarcinoma :  It sounds like he just wants to be kept comfortable,  Please consult hospice in the am 3. Pancreatitis:  Ct scan abd/pelvis pending,  NPO, hydrate with iv ns.  4. Ascites:  Cont lasix, spironolactone  Code Status: DNR/DNI   Family Communication: Kamen Hanken [phone] Disposition Plan: home with hospice  Time spent:  Pearson Grippe  [facility] [phone]  If 7PM-7AM, please contact night-coverage [email or web address] Password [facility] 02/26/2012, 11:26 PM

## 2012-02-27 ENCOUNTER — Telehealth: Payer: Self-pay | Admitting: Hematology and Oncology

## 2012-02-27 ENCOUNTER — Inpatient Hospital Stay (HOSPITAL_COMMUNITY): Payer: Medicare Other

## 2012-02-27 DIAGNOSIS — R0600 Dyspnea, unspecified: Secondary | ICD-10-CM

## 2012-02-27 DIAGNOSIS — R5381 Other malaise: Secondary | ICD-10-CM

## 2012-02-27 DIAGNOSIS — R0989 Other specified symptoms and signs involving the circulatory and respiratory systems: Secondary | ICD-10-CM

## 2012-02-27 DIAGNOSIS — K7290 Hepatic failure, unspecified without coma: Principal | ICD-10-CM

## 2012-02-27 DIAGNOSIS — R109 Unspecified abdominal pain: Secondary | ICD-10-CM

## 2012-02-27 DIAGNOSIS — R531 Weakness: Secondary | ICD-10-CM

## 2012-02-27 LAB — DIFFERENTIAL
Basophils Absolute: 0 10*3/uL (ref 0.0–0.1)
Basophils Relative: 0 % (ref 0–1)
Eosinophils Absolute: 0.1 10*3/uL (ref 0.0–0.7)
Monocytes Relative: 14 % — ABNORMAL HIGH (ref 3–12)
Neutro Abs: 5.4 10*3/uL (ref 1.7–7.7)
Neutrophils Relative %: 60 % (ref 43–77)

## 2012-02-27 LAB — COMPREHENSIVE METABOLIC PANEL
ALT: 84 U/L — ABNORMAL HIGH (ref 0–53)
AST: 120 U/L — ABNORMAL HIGH (ref 0–37)
Albumin: 2.4 g/dL — ABNORMAL LOW (ref 3.5–5.2)
Alkaline Phosphatase: 269 U/L — ABNORMAL HIGH (ref 39–117)
BUN: 48 mg/dL — ABNORMAL HIGH (ref 6–23)
Chloride: 102 mEq/L (ref 96–112)
Potassium: 4.2 mEq/L (ref 3.5–5.1)
Sodium: 136 mEq/L (ref 135–145)
Total Bilirubin: 1.9 mg/dL — ABNORMAL HIGH (ref 0.3–1.2)

## 2012-02-27 LAB — HEMOGLOBIN A1C
Hgb A1c MFr Bld: 5.5 % (ref <5.7)
Mean Plasma Glucose: 111 mg/dL (ref <117)

## 2012-02-27 LAB — RPR: RPR Ser Ql: NONREACTIVE

## 2012-02-27 LAB — CBC
Hemoglobin: 14.1 g/dL (ref 13.0–17.0)
MCH: 36.2 pg — ABNORMAL HIGH (ref 26.0–34.0)
MCHC: 33.9 g/dL (ref 30.0–36.0)
Platelets: 139 10*3/uL — ABNORMAL LOW (ref 150–400)

## 2012-02-27 LAB — GLUCOSE, CAPILLARY: Glucose-Capillary: 78 mg/dL (ref 70–99)

## 2012-02-27 MED ORDER — MORPHINE SULFATE (CONCENTRATE) 20 MG/ML PO SOLN
10.0000 mg | ORAL | Status: DC | PRN
  Administered 2012-02-27 – 2012-02-28 (×2): 10 mg via ORAL
  Filled 2012-02-27 (×2): qty 1

## 2012-02-27 MED ORDER — LORAZEPAM 1 MG PO TABS: 1.0000 mg | ORAL_TABLET | ORAL | Status: DC | PRN

## 2012-02-27 MED ORDER — SPIRONOLACTONE 100 MG PO TABS
200.0000 mg | ORAL_TABLET | Freq: Every day | ORAL | Status: DC
  Administered 2012-02-27: 200 mg via ORAL
  Filled 2012-02-27: qty 2

## 2012-02-27 MED ORDER — TAMSULOSIN HCL 0.4 MG PO CAPS
0.4000 mg | ORAL_CAPSULE | Freq: Every day | ORAL | Status: DC
  Administered 2012-02-27: 0.4 mg via ORAL
  Filled 2012-02-27: qty 1

## 2012-02-27 MED ORDER — ZOLPIDEM TARTRATE 10 MG PO TABS: 10.0000 mg | ORAL_TABLET | Freq: Every evening | ORAL | Status: DC | PRN

## 2012-02-27 MED ORDER — TRAMADOL HCL 50 MG PO TABS
50.0000 mg | ORAL_TABLET | Freq: Four times a day (QID) | ORAL | Status: DC | PRN
  Filled 2012-02-27: qty 1

## 2012-02-27 MED ORDER — NADOLOL 20 MG PO TABS
20.0000 mg | ORAL_TABLET | Freq: Every day | ORAL | Status: DC
  Administered 2012-02-27: 20 mg via ORAL
  Filled 2012-02-27: qty 1

## 2012-02-27 MED ORDER — INSULIN ASPART 100 UNIT/ML ~~LOC~~ SOLN: 0.0000 [IU] | Freq: Three times a day (TID) | SUBCUTANEOUS | Status: DC

## 2012-02-27 MED ORDER — PANTOPRAZOLE SODIUM 40 MG PO TBEC
40.0000 mg | DELAYED_RELEASE_TABLET | Freq: Every day | ORAL | Status: DC
  Administered 2012-02-27: 40 mg via ORAL
  Filled 2012-02-27: qty 1

## 2012-02-27 MED ORDER — SODIUM CHLORIDE 0.9 % IJ SOLN
3.0000 mL | Freq: Two times a day (BID) | INTRAMUSCULAR | Status: DC
  Administered 2012-02-28: 3 mL via INTRAVENOUS

## 2012-02-27 MED ORDER — IOHEXOL 300 MG/ML  SOLN: 100.0000 mL | Freq: Once | INTRAMUSCULAR | Status: DC | PRN

## 2012-02-27 MED ORDER — SODIUM CHLORIDE 0.9 % IJ SOLN
3.0000 mL | Freq: Two times a day (BID) | INTRAMUSCULAR | Status: DC
  Administered 2012-02-27: 3 mL via INTRAVENOUS

## 2012-02-27 MED ORDER — FLUTICASONE PROPIONATE 50 MCG/ACT NA SUSP
1.0000 | Freq: Every day | NASAL | Status: DC
  Administered 2012-02-27: 1 via NASAL
  Filled 2012-02-27: qty 16

## 2012-02-27 MED ORDER — SODIUM CHLORIDE 0.9 % IJ SOLN: 3.0000 mL | INTRAMUSCULAR | Status: DC | PRN

## 2012-02-27 MED ORDER — FUROSEMIDE 80 MG PO TABS
80.0000 mg | ORAL_TABLET | Freq: Every day | ORAL | Status: DC
  Administered 2012-02-27 – 2012-02-28 (×2): 80 mg via ORAL
  Filled 2012-02-27 (×2): qty 1

## 2012-02-27 MED ORDER — GABAPENTIN 300 MG PO CAPS
600.0000 mg | ORAL_CAPSULE | Freq: Three times a day (TID) | ORAL | Status: DC
  Administered 2012-02-27: 600 mg via ORAL
  Filled 2012-02-27 (×3): qty 2

## 2012-02-27 MED ORDER — HYDROMORPHONE HCL PF 1 MG/ML IJ SOLN: 1.0000 mg | INTRAMUSCULAR | Status: DC | PRN

## 2012-02-27 MED ORDER — SODIUM CHLORIDE 0.9 % IV SOLN
250.0000 mL | INTRAVENOUS | Status: DC | PRN
Start: 2012-02-27 — End: 2012-02-28

## 2012-02-27 MED ORDER — MORPHINE SULFATE (CONCENTRATE) 20 MG/ML PO SOLN
10.0000 mg | ORAL | Status: DC | PRN
  Administered 2012-02-27: 10 mg via ORAL
  Filled 2012-02-27: qty 1

## 2012-02-27 MED ORDER — BISACODYL 10 MG RE SUPP: 10.0000 mg | Freq: Every day | RECTAL | Status: DC | PRN

## 2012-02-27 NOTE — ED Provider Notes (Signed)
Medical screening examination/treatment/procedure(s) were performed by non-physician practitioner and as supervising physician I was immediately available for consultation/collaboration.  Prestyn Mahn, MD 02/27/12 0054 

## 2012-02-27 NOTE — Progress Notes (Signed)
SLP Screen Note  Order for swallow evaluation received today.  SLP spoke to Palliative care team who stated pt desires to eat all foods able and is going home with full comfort, hospice care plan in place. Intake documented as 75% for previous meal.   Swallow evaluation cancelled by palliative team, please reorder if desire.  Thanks.  Donavan Burnet, MS [facility] SLP [phone]

## 2012-02-27 NOTE — Consult Note (Signed)
Patient WU:[id number] JESUS NEVILLS      DOB: 30-Jan-1934      JYN:[id number]     Consult Note from the Palliative Medicine Team at [facility]    Consult Requested by: Dr Susie Cassette     PCP: [name], MD Reason for Consultation: Clarification of GOC and options     Phone Number:[phone]  Assessment of patients Current state: diffusely infiltrated liver mass/cirrhosis  econdary to cholangiocarcinoma. ascites, abd pain Continued rapid decline.  No desire for further evaluation or treatment    This NP Lorinda Creed reviewed medical records, received report from team, assessed the patient and then meet at the patient's bedside along with his wife Kathie Rhodes Newlon# [phone] and two daughters Currie Paris # [phone], Antonieta Pert  to discuss diagnosis prognosis, GOC, EOL wishes disposition and options.  A detailed discussion was had today regarding advanced directives.  Concepts specific to code status, artifical feeding and hydration, continued IV antibiotics and rehospitalization was had.  The difference between a aggressive medical intervention path  and a palliative comfort care path for this patient at this time was had.  Values and goals of care important to patient and family were attempted to be elicited.  Concept of Hospice and Palliative Care were discussed  Natural trajectory and expectations at EOL were discussed.  Questions and concerns addressed.  Hard Choices booklet left for review. Family encouraged to call with questions or concerns.  PMT will continue to support holistically.   Goals of Care: 1.  Code Status:DNR/DNI   2. Scope of Treatment: 1.  Focus of care is full comfort - no further diagnostics, no treatment desired for cancer diagnosis -no invasive interventions -no artificial feeding or hydration now or in the future, foods and fluids as tolerated - no antibiotics  -minimize meds only for comfort - O2 for comfort if necessary -leave foley for comfort   4. Disposition:  Transfer to Palliative Care unit and then home with hospice once in place   3. Symptom Management:   1. Anxiety/Agitation: Ativan 1 mg every 4 hrs prn 2. Pain: Roxanol 10 mg every 2 hr prn pain or dyspnea 3. Bowel Regimen:dulcolax   4. Psychosocial:  Emotional support offered to patient and family at bedside, addressed questions and concerns, family verbalized happy memories  5. Spiritual: refuses chaplain support    Patient Documents Completed or Given: Document Given Completed  Advanced Directives Pkt    MOST    DNR    Gone from My Sight    Hard Choices  yes    Brief HPI: rapid decline and weakness over past 2 months, wt loss per family, abdominal distention.  Hypoalbuminemia,  likely hepatic/ cholangiocarcinoma ca (pt wants no follow up)   ROS: intermittent abdominal pain, weakness, wt loss    PMH:  Past Medical History  Diagnosis Date  . CVA (cerebral infarction)     memory deficit  . Hx of laparoscopic gastric banding     morbid obesity  . Diabetes mellitus   . Insomnia   . Allergic rhinitis   . Hyperlipidemia   . Peripheral neuropathy   . Depression   . Osteoarthritis   . Acid reflux   . Syncope   . Stroke 1998    multiple   . Bladder cancer     inflatable penile implant  . Cholangiocarcinoma 2013     PSH: Past Surgical History  Procedure Date  . Lap band 2005  . Penile prosthesis implant  due to bladder cancer  . Bladder surgery     for bladder cancer  . Loop recorder implant     in heart to assess cause of syncope   . Esophagogastroduodenoscopy 02/19/2012    Procedure: ESOPHAGOGASTRODUODENOSCOPY (EGD);  Surgeon: Hart Carwin, MD;  Location: [facility];  Service: Endoscopy;  Laterality: N/A;  . Abdominal surgery   . Penile prosthesis implant   . Bladder cancer 2004  . Loop recorder 2013   I have reviewed the FH and SH and  If appropriate update it with new information. No Known Allergies Scheduled Meds:   . fluticasone  1 spray  Each Nare Daily  . furosemide  80 mg Oral Daily  . lactulose  30 g Oral Once  .  morphine injection  4 mg Intravenous Once  . nadolol  20 mg Oral Daily  . ondansetron (ZOFRAN) IV  4 mg Intravenous Once  . sodium chloride  3 mL Intravenous Q12H  . sodium chloride  3 mL Intravenous Q12H  . DISCONTD: gabapentin  600 mg Oral TID  . DISCONTD: insulin aspart  0-9 Units Subcutaneous TID WC  . DISCONTD: pantoprazole  40 mg Oral Q1200  . DISCONTD: spironolactone  200 mg Oral Daily  . DISCONTD: Tamsulosin HCl  0.4 mg Oral Daily   Continuous Infusions:   . DISCONTD: sodium chloride 100 mL/hr at 02/27/12 1058   PRN Meds:.sodium chloride, LORazepam, morphine, sodium chloride, zolpidem, DISCONTD:  HYDROmorphone (DILAUDID) injection, DISCONTD: iohexol, DISCONTD: morphine, DISCONTD: traMADol    BP 109/71  Pulse 68  Temp 97.7 F (36.5 C) (Oral)  Resp 16  Ht 5\' 11"  (1.803 m)  Wt 95.8 kg (211 lb 3.2 oz)  BMI 29.46 kg/m2  SpO2 97%   PPS: 30 %  No intake or output data in the 24 hours ending 02/27/12 1341                      Stool Softner: dulcolax  Physical Exam:  General:  Ill appearing NAD HEENT:  moist mucous membranes, + temporal muscle wasting Chest:   diminished in bases CVS: RRR Abdomen: distended, minimal tenderness on palpation Ext: trace edema Neuro:alert and oriented X3  Labs: CBC    Component Value Date/Time   WBC 9.0 02/27/2012 0454   RBC 3.90* 02/27/2012 0454   HGB 14.1 02/27/2012 0454   HCT 41.6 02/27/2012 0454   PLT 139* 02/27/2012 0454   MCV 106.7* 02/27/2012 0454   MCH 36.2* 02/27/2012 0454   MCHC 33.9 02/27/2012 0454   RDW 16.3* 02/27/2012 0454   LYMPHSABS 2.3 02/27/2012 0454   MONOABS 1.3* 02/27/2012 0454   EOSABS 0.1 02/27/2012 0454   BASOSABS 0.0 02/27/2012 0454    BMET    Component Value Date/Time   NA 136 02/27/2012 0454   K 4.2 02/27/2012 0454   CL 102 02/27/2012 0454   CO2 25 02/27/2012 0454   GLUCOSE 82 02/27/2012 0454   BUN 48* 02/27/2012 0454    CREATININE 1.60* 02/27/2012 0454   CALCIUM 8.9 02/27/2012 0454   GFRNONAA 41* 02/27/2012 0454   GFRAA 47* 02/27/2012 0454    CMP     Component Value Date/Time   NA 136 02/27/2012 0454   K 4.2 02/27/2012 0454   CL 102 02/27/2012 0454   CO2 25 02/27/2012 0454   GLUCOSE 82 02/27/2012 0454   BUN 48* 02/27/2012 0454   CREATININE 1.60* 02/27/2012 0454   CALCIUM 8.9 02/27/2012 0454   PROT 6.1  02/27/2012 0454   ALBUMIN 2.4* 02/27/2012 0454   AST 120* 02/27/2012 0454   ALT 84* 02/27/2012 0454   ALKPHOS 269* 02/27/2012 0454   BILITOT 1.9* 02/27/2012 0454   GFRNONAA 41* 02/27/2012 0454   GFRAA 47* 02/27/2012 0454      Time In Time Out Total Time Spent with Patient Total Overall Time  1230 1400 85 min  90 min    Greater than 50%  of this time was spent counseling and coordinating care related to the above assessment and plan.  Dr Susie Cassette aware of above  Sylvan Cheese NP

## 2012-02-27 NOTE — Progress Notes (Signed)
[facility] PROGRESS NOTE  Lance Best ZOX:[id number] DOB: [birth year] DOA: 02/26/2012 PCP: [name], MD  Assessment/Plan: Active Problems:  Altered mental status  1. Altered ms: Likely secondary to hepatic encephalopathy, Lactulose, recheck ammonia, check b12, folate, tsh, ana , esr, rpr, Check CT brain noncontrast.  2. Cholangiocarcinoma : It sounds like he just wants to be kept comfortable, palliative care consult for goals of care 3. Probable Pancreatitis: Given elevated lipase, however CT scan does not show pancreatitis, pending, NPO, hydrate with iv ns.       4.  Ascites: Ascites secondary to #2, continue diuretics using spironolactone and Lasix. Patient also advised to follow a low-sodium diet and to restrict the amount of proteins of his right lower diet. At this moment he is no short of breath, and even in his belly is distended and with positive wave fluid on exam do not require another paracentesis at this moment; he may ended requiring repetitive low Volume paracentesis as an outpatient.     Code Status: DNR/DNI , family wants comfort measures, no labs  Family Communication: Lance Best [phone]  Disposition Plan: home with hospice  Brief narrative: 78 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    Consultants:  Palliative care consultation  Procedures:  CT scan abdomen and pelvis  Antibiotics:  None  HPI/Subjective: Complaining of weakness  Objective: Filed Vitals:   02/26/12 1935 02/26/12 2340 02/27/12 0100  BP: 116/72 106/69 109/71  Pulse:  67 68  Temp: 97.2 F (36.2 C) 98.7 F (37.1 C) 97.7 F (36.5 C)  TempSrc: Oral Oral Oral  Resp: 20 22 16   Height:   5\' 11"  (1.803 m)  Weight:   95.8 kg (211 lb 3.2 oz)  SpO2: 96% 100% 97%   No intake or output data in the 24 hours ending 02/27/12 0959  Exam:  HENT:  Head:  Atraumatic.  Nose: Nose normal.  Mouth/Throat: Oropharynx is clear and moist.  Eyes: Conjunctivae are normal. Pupils are equal, round, and reactive to light. No scleral icterus.  Neck: Neck supple. No tracheal deviation present.  Cardiovascular: Normal rate, regular rhythm, normal heart sounds and intact distal pulses.  Pulmonary/Chest: Effort normal and breath sounds normal. No respiratory distress.  Abdominal: Soft. Normal appearance and bowel sounds are normal. She exhibits no distension. There is no tenderness.  Musculoskeletal: She exhibits no edema and no tenderness.  Neurological: She is alert. No cranial nerve deficit.    Data Reviewed: Basic Metabolic Panel:  Lab 02/27/12 5621 02/26/12 2045 02/23/12 0326  NA 136 137 133*  K 4.2 4.1 3.9  CL 102 102 99  CO2 25 26 26   GLUCOSE 82 88 124*  BUN 48* 48* 32*  CREATININE 1.60* 1.43* 1.50*  CALCIUM 8.9 9.3 8.5  MG -- -- --  PHOS -- -- --    Liver Function Tests:  Lab 02/27/12 0454 02/26/12 2045  AST 120* 140*  ALT 84* 98*  ALKPHOS 269* 298*  BILITOT 1.9* 2.1*  PROT 6.1 6.8  ALBUMIN 2.4* 2.6*    Lab 02/26/12 2045  LIPASE 220*  AMYLASE --    Lab 02/27/12 0454 02/26/12 2045  AMMONIA 94* 95*    CBC:  Lab 02/27/12 0454 02/26/12 2045 02/23/12 0326 02/21/12 0415  WBC 9.0 9.3 9.2 7.8  NEUTROABS 5.4 6.0 -- --  HGB 14.1 15.2 13.5 13.5  HCT 41.6 43.3 39.6 38.9*  MCV 106.7*  104.8* 104.8* 102.6*  PLT 139* 152 131* 107*    Cardiac Enzymes: No results found for this basename: CKTOTAL:5,CKMB:5,CKMBINDEX:5,TROPONINI:5 in the last 168 hours BNP (last 3 results) No results found for this basename: PROBNP:3 in the last 8760 hours   CBG:  Lab 02/27/12 0751  GLUCAP 78    Recent Results (from the past 240 hour(s))  BODY FLUID CULTURE     Status: Normal   Collection Time   02/18/12 11:07 AM      Component Value Range Status Comment   Specimen Description ASCITIC   Final    Special Requests Normal   Final    Gram  Stain     Final    Value: NO WBC SEEN     NO ORGANISMS SEEN   Culture NO GROWTH 3 DAYS   Final    Report Status 02/22/2012 FINAL   Final      Studies: Ct Head Wo Contrast  02/27/2012  *RADIOLOGY REPORT*  Clinical Data: Altered mental status  CT HEAD WITHOUT CONTRAST  Technique:  Contiguous axial images were obtained from the base of the skull through the vertex without contrast.  Comparison: CT 02/17/2012  Findings: Moderate chronic ischemic changes in the white matter. Small chronic infarct in the right frontal cortex.  Small chronic infarct in the right parietal cortex.  These are unchanged. Negative for acute infarct.  Negative for hemorrhage or mass.  Ossification of the transverse ligament causing spinal stenosis at the C1 level.  This is unchanged from the  prior study.  IMPRESSION: Chronic ischemic changes.  No acute intracranial abnormality.  Original Report Authenticated By: Camelia Phenes, M.D.   Ct Head W Wo Contrast  02/17/2012  *RADIOLOGY REPORT*  Clinical Data: New diagnosis liver cancer.  CT HEAD WITHOUT AND WITH CONTRAST  Technique:  Contiguous axial images were obtained from the base of the skull through the vertex without and with intravenous contrast.  Contrast: OMNIPAQUE IOHEXOL 300 MG/ML  SOLN  Comparison: 10/23/2010  Findings: Extensive low density throughout the periventricular and deep white matter, likely chronic ischemic changes, stable since prior study.  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  No enhancing lesions.  No acute infarction or hemorrhage.  No hydrocephalus.  No midline shift.  IMPRESSION: Advanced chronic small vessel disease.  Mild atrophy.  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  No acute findings.  Original Report Authenticated By: Cyndie Chime, M.D.   Ct Chest W Contrast  02/17/2012  *RADIOLOGY REPORT*  Clinical Data: Liver tumors.  Evaluate for metastatic disease.  CT CHEST WITH CONTRAST  Technique:  Multidetector CT imaging  of the chest was performed following the standard protocol during bolus administration of intravenous contrast.  Contrast: OMNIPAQUE IOHEXOL 300 MG/ML  SOLN  Comparison: CT of the chest 10/23/2010.  Findings:  Mediastinum: Heart size is normal. There is no significant pericardial fluid, thickening or pericardial calcification. There is atherosclerosis of the thoracic aorta, the great vessels of the mediastinum and the coronary arteries, including calcified atherosclerotic plaque in the left main, left anterior descending, left circumflex and right coronary arteries. Postoperative changes of lap band procedure near the gastroesophageal junction. No pathologically enlarged mediastinal or hilar lymph nodes.  Lungs/Pleura: Subsegmental atelectasis and/or scarring in the left lower lobe.  There are a few tiny subpleural nodules in the posterior aspect of the right lung, several which are calcified; these are tiny and nonspecific, favored to represent either granulomas or subpleural lymph  nodes.  No other larger more suspicious appearing pulmonary nodules or masses are otherwise identified to suggest presence of metastatic disease to the lungs on today's examination.  No acute consolidative airspace disease. Trace left pleural effusion.  Upper Abdomen: Please see dictation from CT scan of 02/16/2012 for full description of upper abdominal findings.  Musculoskeletal: There are no aggressive appearing lytic or blastic lesions noted in the visualized portions of the skeleton. Compression fracture of L1 with approximately 50% loss of anterior vertebral body height is unchanged.  IMPRESSION: 1.  No findings to suggest metastatic disease to the thorax on today's examination. 2. Atherosclerosis, including left main and three-vessel coronary artery disease. 3.  Subsegmental atelectasis and/or scarring in the left lower lobe. 4.  Additional incidental findings, as above.  Original Report Authenticated By: Florencia Reasons,  M.D.   US Abdomen Complete  02/17/2012  *RADIOLOGY REPORT*  Clinical Data:  Abdominal pain  ABDOMINAL ULTRASOUND COMPLETE  Comparison:  02/2012 CT  Findings:  Gallbladder:  Cholelithiasis noted.  Dominant echogenic shadowing gallstone measures 4.5 cm.  Normal wall thickness measuring 2.3 mm. No Murphy's sign.  No evidence of cholecystitis.  Common Bile Duct:  Within normal limits in caliber.  Liver: Diffusely heterogeneous and echogenic anterior right liver and left hepatic lobe, concerning for underlying infiltrative tumor process suchas HCC when correlating with the CT findings. Perihepatic ascites noted.  No biliary dilatation.  IVC:  Appears normal.  Pancreas:  No abnormality identified.  Spleen:  Within normal limits in size and echotexture.  Right kidney:  Normal in size and parenchymal echogenicity.  No evidence of mass or hydronephrosis.  Left kidney:  Normal in size and parenchymal echogenicity.  Upper pole lateral hypoechoic cyst is noted with increased through transmission measuring 2 cm.  Abdominal Aorta:  Atherosclerotic changes.  Negative for aneurysm.  IMPRESSION: Abnormal heterogeneous liver with increased echogenicity throughout the anterior right lobe and the left lobe concerning for an infiltrative tumor process when compared to the CT.  Abdominal ascites  Cholelithiasis but no evidence of wall thickening or Murphy's sign to suggest acute cholecystitis  No biliary dilatation  Left renal cyst  Original Report Authenticated By: Judie Petit. Ruel Favors, M.D.   Ct Abdomen Pelvis W Contrast  02/27/2012  *RADIOLOGY REPORT*  Clinical Data: Abdominal pain.  Recent liver biopsy showing bile duct cancer.  Elevated lipase.  CT ABDOMEN AND PELVIS WITH CONTRAST  Technique:  Multidetector CT imaging of the abdomen and pelvis was performed following the standard protocol during bolus administration of intravenous contrast.  Contrast:  100 ml Omnipaque-300 IV  Comparison: CT 02/16/2012  Findings: Infiltrative mass  in the right lobe of the liver is unchanged from the prior CT.  This measures approximately 6 x 17 cm.  Calcified small gallstones are present.  No masses identified within the gallbladder.  Moderate to large ascites has increased in the interval.  No subcapsular hematoma is identified.  There are findings compatible with cirrhosis with irregular contour of the liver and enlargement of the caudate lobe.  This is unchanged from the  prior study. Probable portal hypertension is present.  The pancreas is normal.  Spleen is normal in size.  Nonobstructing left renal stone.  No hydronephrosis or renal mass.  Left renal cyst complex cyst is noted.  Negative for bowel obstruction.  Sigmoid diverticulosis.  Gastric banding procedure has been performed with gastric band in satisfactory position.  Moderate to severe compression fracture of L1 is unchanged from prior study.  Diffuse aortic atherosclerotic disease is present without aneurysm.  IMPRESSION: Infiltrative mass in the liver has been recently biopsied showing carcinoma.  This is unchanged.  No evidence of subcapsular hematoma.  Moderate to large amount of ascites, with increased from the recent CT of 02/16/2012.  Cirrhosis and portal hypertension.  Normal appearing pancreas. Gallstones  Original Report Authenticated By: Camelia Phenes, M.D.   Ct Abdomen Pelvis W Contrast  02/16/2012  *RADIOLOGY REPORT*  Clinical Data: Abdominal distension and pain.  CT ABDOMEN AND PELVIS WITH CONTRAST  Technique:  Multidetector CT imaging of the abdomen and pelvis was performed following the standard protocol during bolus administration of intravenous contrast.  Contrast: OMNIPAQUE IOHEXOL 300 MG/ML  SOLN  Comparison: No priors.  Findings:  Lung Bases: Scarring or subsegmental atelectasis in the left lower lobe. There is atherosclerosis of the thoracic aorta and the coronary arteries, including calcified atherosclerotic plaque in the the left main, left anterior descending,  left circumflex and right coronary arteries.  Abdomen/Pelvis:  There is a diffusely infiltrative mass in the liver involving segments II, III, IVa/IVb, V and VIII. This mass is heterogeneous in appearance with multifocal areas of low attenuation and heterogeneous enhancement.  The liver generally has a slightly shrunken and nodular contour in the area of the mass, but there appears to be some mild hypertrophy of the right lobe and definite hypertrophy of the caudate lobe.  Numerous gallstones are noted dependently within the lumen of the gallbladder.  The gallbladder wall appears mildly thickened, and there is a small amount of pericholecystic fluid, however, the overall appearance is not strongly suggestive of acute cholecystitis (findings are compounded by the presence of the surrounding ascites).  The appearance of the pancreas, spleen and bilateral adrenal glands is unremarkable.  Mild parenchymal thinning is noted at multiple regions in the kidneys bilaterally, which likely reflects some scarring.  2.2 cm exophytic cyst in the interpolar region of the left kidney.  There is a densely calcified lesion extending off the upper pole of the left kidney.  A nonobstructive 6 mm calculus is noted in the interpolar collecting system of the left kidney.  Moderate - large volume of ascites. Extensive atherosclerosis throughout the abdominal and pelvic vasculature, without definite aneurysm or dissection.  Status post lap band procedure.  Numerous colonic diverticulae.  Given the presence of the ascites, accurate assessment for signs of acute colonic diverticulitis is not possible on today's examination.  Numerous dilated perianal and perirectal veins, compatible with hemorrhoids.  Urinary bladder is unremarkable.  A penile prosthesis is noted with a reservoir in the lower left anatomic pelvis.  Musculoskeletal: Diffuse body wall edema There are no aggressive appearing lytic or blastic lesions noted in the visualized  portions of the skeleton.  Compression fracture at L1 with approximately 50% loss of anterior vertebral body height has an appearance suggestive of old injury.  IMPRESSION: 1.  The appearance of the liver is highly concerning for an infiltrative neoplasm, and given and the findings of cirrhosis, is most concerning for a diffusely infiltrative hepatocellular carcinoma. 2.  Moderate - large volume of ascites. 3.  Cholelithiasis.  Accurate assessment for acute cholecystitis is not possible on this examination given the adjacent ascites. 4.  Colonic diverticulosis.  Accurate assessment for acute diverticulitis is not possible on this examination given the presence of the ascites. 5. Atherosclerosis, including left main and three-vessel coronary artery disease. 6.  Additional incidental findings, as above.  Original Report Authenticated By: Florencia Reasons,  M.D.   US Biopsy  02/21/2012  *RADIOLOGY REPORT*  Indication: Infiltrative hepatic mass adjacent to the gallbladder fossa  ULTRASOUND GUIDED LIVER LESION BIOPSY  Comparison: CT abdomen pelvis - 02/16/2012; abdominal ultrasound - 02/16/2012; ultrasound guided paracentesis - 02/18/2012  Intravenous medications: Fentanyl 100 mcg IV; Versed 1 mg IV  Total Moderate Sedation time: 15 minutes  Complications: None immediate  Procedure:  Informed written consent was obtained from the patient after a discussion of the risks, benefits and alternatives to treatment. The patient understands and consents the procedure.  A timeout was performed prior to the initiation of the procedure.  Ultrasound scanning was performed of the right upper abdominal quadrant demonstrates ill-defined increased heterogeneous echotexture of the gallbladder fossa which correlates with the infiltrating mass seen on preprocedural abdominal CT.  A small amount of ascites remains following recent ultrasound guided paracentesis.  The segment of the infiltrative mass within the medial aspect of the left  lobe of the liver adjacent to the gallbladder fossa was selected for biopsy.  The procedure was planned.  The right upper abdominal quadrant was prepped and draped in the usual sterile fashion.  The overlying soft tissues were anesthetized with 1% lidocaine with epinephrine.  A 17 gauge, 6.8 cm co-axial needle was advanced into a peripheral aspect of the infiltrative mass and 2 fine needle aspirates were obtained with a Francine needle.  Quick stain pathologic review demonstrated scattered atypical cells. This was followed by the acquisition of 3 core biopsies with an 18 gauge core device under direct ultrasound guidance.  As there was a small amount of back bleeding from the coaxial needle, and given the presence of a minimal amount of ascites, the tract was embolized with a small amount of Gelfoam.  The co-axial needle was removed and hemostasis was obtained with manual compression.  Post procedural scanning was negative for definitive area of hemorrhage or additional complication.  A dressing was placed.  The patient tolerated the procedure well without immediate post procedural complication.  Impression:  Technically successful ultrasound guided fine needle aspiration and core needle biopsy of the infiltrative mass about the gallbladder fossa involving the medial segment of the left lobe of the liver  Original Report Authenticated By: Waynard Reeds, M.D.   US Paracentesis  02/18/2012  *RADIOLOGY REPORT*  Clinical Data: Prior history of bladder carcinoma; now with infiltrative liver tumor and ascites; request is made for diagnostic and therapeutic paracentesis.  ULTRASOUND GUIDED DIAGNOSTIC AND THERAPEUTIC  PARACENTESIS  An ultrasound guided paracentesis was thoroughly discussed with the patient and questions answered.  The benefits, risks, alternatives and complications were also discussed.  The patient understands and wishes to proceed with the procedure.  Written consent was obtained.  Ultrasound was  performed to localize and mark an adequate pocket of fluid in the right upper to mid quadrant of the abdomen.  The area was then prepped and draped in the normal sterile fashion.  1% Lidocaine was used for local anesthesia.  Under ultrasound guidance a 19 gauge Yueh catheter was introduced.  Paracentesis was performed.  The catheter was removed and a dressing applied.  Complications:  none  Findings:  A total of approximately 2.8 liters of yellow fluid was removed.  A fluid sample was sent for laboratory analysis.  IMPRESSION: Successful ultrasound guided diagnostic and therapeutic paracentesis yielding 2.8 liters of ascites.  Read by: Jeananne Rama, P.A.-C  Original Report Authenticated By: Waynard Reeds, M.D.   Dg Abd 2 Views  02/16/2012  *RADIOLOGY REPORT*  Clinical Data: Abdominal bloating, history of lap band  ABDOMEN - 2 VIEW  Comparison: Lumbar spine films of 01/18/2011  Findings: Although the orientation of the lap band is rather vertical, this does not appear to have changed when compared to the prior lumbar spine view from July 2012.  No free air is seen on the erect view.  However, on the supine views there is dilatation of a loop of small bowel in the left abdomen up to 4 cm.  This is worrisome for developing partial small bowel obstruction.  If further assessment is warranted CT of the abdomen pelvis with IV contrast media is recommended.  No colonic distention is seen.  The bones are osteopenic and there are diffuse degenerative changes throughout the lumbar spine.  IMPRESSION:  1. Possible developing partial small bowel obstruction.  Consider CT abdomen and pelvis with IV contrast to assess further. 2.  No free air. 3.  No change in somewhat vertical orientation of the lap band.  Original Report Authenticated By: Juline Patch, M.D.    Scheduled Meds:   . fluticasone  1 spray Each Nare Daily  . furosemide  80 mg Oral Daily  . gabapentin  600 mg Oral TID  . insulin aspart  0-9 Units  Subcutaneous TID WC  . lactulose  30 g Oral Once  .  morphine injection  4 mg Intravenous Once  . nadolol  20 mg Oral Daily  . ondansetron (ZOFRAN) IV  4 mg Intravenous Once  . pantoprazole  40 mg Oral Q1200  . sodium chloride  3 mL Intravenous Q12H  . sodium chloride  3 mL Intravenous Q12H  . spironolactone  200 mg Oral Daily  . Tamsulosin HCl  0.4 mg Oral Daily   Continuous Infusions:   . sodium chloride 75 mL/hr at 02/27/12 0212    Active Problems:  Altered mental status    Time spent: 40 minutes   [facility]  [facility] Pager [phone]. If 8PM-8AM, please contact night-coverage at [email or web address], password [facility] 02/27/2012, 9:59 AM  LOS: 1 day

## 2012-02-27 NOTE — Telephone Encounter (Signed)
C/D on 8/19 for appt 8/21.

## 2012-02-27 NOTE — Progress Notes (Addendum)
Notified by [facility], patient and family request services of [facility]) after discharge.  Patient information reviewed with Dr Elliot Gurney, [facility] Medical Director hospice dx: Cholangiocarcinoma. Spoke with patient, daughters Lance Best and wife Lance Best at bedside education related to hospice services, philosophy and team approach to care initiated with good understanding voiced by family. Per family/chart notes plan is to d/c tomorrow by non-emergent transport  DME needs discussed -pt currently has hospital bed & BSC from [facility]; requests over-bed table and  Oxygen Package B- pt currently on 2 LNC continuous - at this time wife Lance Best stated she does not want a Nurse, adult delivered to the home -she is concerned about limited space in their small 2 bedroom apartment- she would like to discuss any further DME needs once the patient gets home with the [facility] RN - family requests [facility] representative call daughter [name] [phone] to arrange delivery time - family hopeful to have the O2 in place prior to patient d/c from hospital.  Family also voiced concern about patient's increasing care needs; wife voiced at this time they want to respect his wishes to be at home and after discussing option of residential hospice/[name] they voiced relief in knowing the  [name] primary care team will work with them once patient is at home to further evaluate for transition to [facility] when/if needed.  Initial paperwork faxed to [name] in anticipation of d/c  Please notify [facility] when patient is ready to leave unit at d/c call [phone] (or [phone] if after 5 pm);  [facility] information and contact numbers also given to wife Lance Best during visit.   Above information shared with Lanier Clam [facility] and she will notify [facility] representative re: DME needs. Please call with any questions or concerns   Valente David, RN 02/27/2012, 3:29 PM [facility] Team RN Liaison [phone]

## 2012-02-27 NOTE — ED Notes (Signed)
Pt wife states that pt was discharged from hospital on Thursday after staying here for 1wk. He is supposed to have fluid drawn from abdomen on tomorrow. He has appt. With Dr. Pricilla Loveless and Madelin Rear.

## 2012-02-27 NOTE — Progress Notes (Signed)
Brief Nutrition Note  Reason: Nutrition Risk  Patient had GOC meeting this afternoon. Patient is now comfort care only. Diet has been liberalized to regular diet. Patient reported he likes chocolate ice cream. Will order patient snack of chocolate ice cream.   RD available for nutrition needs.   [facility] [phone]

## 2012-02-27 NOTE — Care Management Note (Signed)
    Page 1 of 2   02/28/2012     12:50:31 PM   CARE MANAGEMENT NOTE 02/28/2012  Patient:  Lance Best, Lance Best   Account Number:  [id number]  Date Initiated:  02/27/2012  Documentation initiated by:  Lanier Clam  Subjective/Objective Assessment:   ADMITTED W/AMS.HEPATIC ENCEPHALOPATHY     Action/Plan:   FROM HOME W/SPOUSE.HAS RW,3N1,HOSPITAL BED.[facility] WAS GOING TO DO START OF CARE,BUT REHOSPITALIZED PRIOR.   Anticipated DC Date:  02/28/2012   Anticipated DC Plan:  HOME W HOSPICE CARE      DC Planning Services  CM consult      Choice offered to / List presented to:  C-3 Spouse   DME arranged  [facility]  OVERBED TABLE      DME agency  [facility]        [facility] agency  [facility] [place]   Status of service:  Completed, signed off Medicare Important Message given?   (If response is "NO", the following Medicare IM given date fields will be blank) Date Medicare IM given:   Date Additional Medicare IM given:    Discharge Disposition:  HOME W HOSPICE CARE  Per UR Regulation:  Reviewed for med. necessity/level of care/duration of stay  If discussed at Long Length of Stay Meetings, dates discussed:    Comments:  02/28/12 [facility] RN,BSN NCM [phone] HOME W/[facility] SERVICES,[facility]-DME 02/OVERBED TABLE.TRANSP-[facility].  02/27/12 Ponce Skillman RN,BSN NCM [phone] FAMILY NOW ONLY NEEDS 02,OVERBED TABLE,AMBULANCE TRANSP.DTR CONTACT FOR DELIVERY:TRACEY [phone].MARGIE [facility] LIASON/[facility] DME DARIN(LIASON) FOLLOWING FOR D/C HOPEFUL IN AM.MD UPDATED.  FOR TRANSFER TO PALLIATIVE CARE FLOOR.SPOUSE CHOSE [facility] FOR HOME,MARGIE(LIASON) INFORMED, WOULD LIKE 02,& HOYER LIFT.[facility] SUSAN DALE INFORMED OF DME.WILL NEED AMBULANCE TRANSPORTATION.

## 2012-02-27 NOTE — Progress Notes (Signed)
Palliative Medicine Consult received, confirmed with Dr Susie Cassette for symptom management, confirm goals, hospice options spoke with patient, daughters- Raynelle Fanning and [name] and wife Kathie Rhodes at bedside meeting scheduled for today Monday 8/19 @ 12:30- 1 pm    Valente David, RN 02/27/2012, 12:50 PM Palliative Medicine Team RN Liaison [phone]

## 2012-02-28 MED ORDER — LACTULOSE 10 GM/15ML PO SOLN: 20.0000 g | Freq: Three times a day (TID) | ORAL | Status: AC

## 2012-02-28 MED ORDER — OXYCODONE-ACETAMINOPHEN 10-325 MG PO TABS: 1.0000 | ORAL_TABLET | ORAL | Status: AC | PRN

## 2012-02-28 MED ORDER — LORAZEPAM 1 MG PO TABS: 1.0000 mg | ORAL_TABLET | ORAL | Status: AC | PRN

## 2012-02-28 MED ORDER — MORPHINE SULFATE (CONCENTRATE) 20 MG/ML PO SOLN: 10.0000 mg | ORAL | Status: AC | PRN

## 2012-02-28 NOTE — Progress Notes (Signed)
OT Screen Order received, chart reviewed. Pt is comfort care, returning home with hospice services per RN. Pt has all necessary DME. Will sign off at this time.  Garrel Ridgel, OTR/L  Pager [phone] 02/28/2012

## 2012-02-28 NOTE — Progress Notes (Signed)
D/C HOME [facility] SERVICES,[facility] DME-02,OVERBED TABLE DELIVERED.TRANSP VIA [facility].

## 2012-02-28 NOTE — Discharge Summary (Signed)
Physician Discharge Summary  Lance Best MRN: [id number] DOB/AGE: 1934-02-27 78 y.o.  PCP: [name], MD   Admit date: 02/26/2012 Discharge date: 02/28/2012  Discharge Diagnoses:     Altered mental status  Dyspnea  Weakness generalized  Abdominal pain   Medication List  As of 02/28/2012 11:10 AM   TAKE these medications         CENTRUM SILVER PO   Take 1 tablet by mouth daily.      esomeprazole 40 MG capsule   Commonly known as: NEXIUM   Take 40 mg by mouth daily before breakfast.      fluticasone 50 MCG/ACT nasal spray   Commonly known as: FLONASE   Place 1 spray into the nose daily.      furosemide 80 MG tablet   Commonly known as: LASIX   Take 80 mg by mouth daily.      gabapentin 600 MG tablet   Commonly known as: NEURONTIN   Take 600 mg by mouth 3 (three) times daily.      guaiFENesin 100 MG/5ML liquid   Commonly known as: ROBITUSSIN   Take 200 mg by mouth 3 (three) times daily as needed. CONGESTION      LORazepam 1 MG tablet   Commonly known as: ATIVAN   Take 1 tablet (1 mg total) by mouth every 4 (four) hours as needed for anxiety.      morphine 20 MG/ML concentrated solution   Commonly known as: ROXANOL   Take 0.5 mLs (10 mg total) by mouth every 2 (two) hours as needed.      nadolol 20 MG tablet   Commonly known as: CORGARD   Take 20 mg by mouth daily.      oxyCODONE-acetaminophen 10-325 MG per tablet   Commonly known as: PERCOCET   Take 1 tablet by mouth every 4 (four) hours as needed. pain      oxyCODONE-acetaminophen 10-325 MG per tablet   Commonly known as: PERCOCET   Take 1 tablet by mouth every 4 (four) hours as needed. pain      promethazine 12.5 MG tablet   Commonly known as: PHENERGAN   Take 12.5 mg by mouth every 6 (six) hours as needed.      SOLUBLE FIBER/PROBIOTICS PO   Take 1 tablet by mouth daily.      spironolactone 100 MG tablet   Commonly known as: ALDACTONE   Take 200 mg by mouth daily.     Tamsulosin HCl 0.4 MG Caps   Commonly known as: FLOMAX   Take 0.4 mg by mouth daily.      traMADol 50 MG tablet   Commonly known as: ULTRAM   Take 50 mg by mouth every 6 (six) hours as needed. Pain      zolpidem 10 MG tablet   Commonly known as: AMBIEN   Take 10 mg by mouth at bedtime as needed. sleep           lactulose 10 g by mouth 3 times a day      Discharge Condition: Stable  Disposition: 01-Home or Self Care   Consults:  Palliative care  Significant Diagnostic Studies: Ct Head Wo Contrast  02/27/2012  *RADIOLOGY REPORT*  Clinical Data: Altered mental status  CT HEAD WITHOUT CONTRAST  Technique:  Contiguous axial images were obtained from the base of the skull through the vertex without contrast.  Comparison: CT 02/17/2012  Findings: Moderate chronic ischemic changes in the white matter. Small chronic  infarct in the right frontal cortex.  Small chronic infarct in the right parietal cortex.  These are unchanged. Negative for acute infarct.  Negative for hemorrhage or mass.  Ossification of the transverse ligament causing spinal stenosis at the C1 level.  This is unchanged from the  prior study.  IMPRESSION: Chronic ischemic changes.  No acute intracranial abnormality.  Original Report Authenticated By: Camelia Phenes, M.D.   Ct Head W Wo Contrast  02/17/2012  *RADIOLOGY REPORT*  Clinical Data: New diagnosis liver cancer.  CT HEAD WITHOUT AND WITH CONTRAST  Technique:  Contiguous axial images were obtained from the base of the skull through the vertex without and with intravenous contrast.  Contrast: OMNIPAQUE IOHEXOL 300 MG/ML  SOLN  Comparison: 10/23/2010  Findings: Extensive low density throughout the periventricular and deep white matter, likely chronic ischemic changes, stable since prior study.  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  No enhancing lesions.  No acute infarction or hemorrhage.  No hydrocephalus.  No midline shift.  IMPRESSION: Advanced chronic  small vessel disease.  Mild atrophy.  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  No acute findings.  Original Report Authenticated By: Cyndie Chime, M.D.   Ct Chest W Contrast  02/17/2012  *RADIOLOGY REPORT*  Clinical Data: Liver tumors.  Evaluate for metastatic disease.  CT CHEST WITH CONTRAST  Technique:  Multidetector CT imaging of the chest was performed following the standard protocol during bolus administration of intravenous contrast.  Contrast: OMNIPAQUE IOHEXOL 300 MG/ML  SOLN  Comparison: CT of the chest 10/23/2010.  Findings:  Mediastinum: Heart size is normal. There is no significant pericardial fluid, thickening or pericardial calcification. There is atherosclerosis of the thoracic aorta, the great vessels of the mediastinum and the coronary arteries, including calcified atherosclerotic plaque in the left main, left anterior descending, left circumflex and right coronary arteries. Postoperative changes of lap band procedure near the gastroesophageal junction. No pathologically enlarged mediastinal or hilar lymph nodes.  Lungs/Pleura: Subsegmental atelectasis and/or scarring in the left lower lobe.  There are a few tiny subpleural nodules in the posterior aspect of the right lung, several which are calcified; these are tiny and nonspecific, favored to represent either granulomas or subpleural lymph nodes.  No other larger more suspicious appearing pulmonary nodules or masses are otherwise identified to suggest presence of metastatic disease to the lungs on today's examination.  No acute consolidative airspace disease. Trace left pleural effusion.  Upper Abdomen: Please see dictation from CT scan of 02/16/2012 for full description of upper abdominal findings.  Musculoskeletal: There are no aggressive appearing lytic or blastic lesions noted in the visualized portions of the skeleton. Compression fracture of L1 with approximately 50% loss of anterior vertebral body height is unchanged.   IMPRESSION: 1.  No findings to suggest metastatic disease to the thorax on today's examination. 2. Atherosclerosis, including left main and three-vessel coronary artery disease. 3.  Subsegmental atelectasis and/or scarring in the left lower lobe. 4.  Additional incidental findings, as above.  Original Report Authenticated By: Florencia Reasons, M.D.   US Abdomen Complete  02/17/2012  *RADIOLOGY REPORT*  Clinical Data:  Abdominal pain  ABDOMINAL ULTRASOUND COMPLETE  Comparison:  02/2012 CT  Findings:  Gallbladder:  Cholelithiasis noted.  Dominant echogenic shadowing gallstone measures 4.5 cm.  Normal wall thickness measuring 2.3 mm. No Murphy's sign.  No evidence of cholecystitis.  Common Bile Duct:  Within normal limits in caliber.  Liver: Diffusely heterogeneous and echogenic  anterior right liver and left hepatic lobe, concerning for underlying infiltrative tumor process suchas HCC when correlating with the CT findings. Perihepatic ascites noted.  No biliary dilatation.  IVC:  Appears normal.  Pancreas:  No abnormality identified.  Spleen:  Within normal limits in size and echotexture.  Right kidney:  Normal in size and parenchymal echogenicity.  No evidence of mass or hydronephrosis.  Left kidney:  Normal in size and parenchymal echogenicity.  Upper pole lateral hypoechoic cyst is noted with increased through transmission measuring 2 cm.  Abdominal Aorta:  Atherosclerotic changes.  Negative for aneurysm.  IMPRESSION: Abnormal heterogeneous liver with increased echogenicity throughout the anterior right lobe and the left lobe concerning for an infiltrative tumor process when compared to the CT.  Abdominal ascites  Cholelithiasis but no evidence of wall thickening or Murphy's sign to suggest acute cholecystitis  No biliary dilatation  Left renal cyst  Original Report Authenticated By: Judie Petit. Ruel Favors, M.D.   Ct Abdomen Pelvis W Contrast  02/27/2012  *RADIOLOGY REPORT*  Clinical Data: Abdominal pain.  Recent  liver biopsy showing bile duct cancer.  Elevated lipase.  CT ABDOMEN AND PELVIS WITH CONTRAST  Technique:  Multidetector CT imaging of the abdomen and pelvis was performed following the standard protocol during bolus administration of intravenous contrast.  Contrast:  100 ml Omnipaque-300 IV  Comparison: CT 02/16/2012  Findings: Infiltrative mass in the right lobe of the liver is unchanged from the prior CT.  This measures approximately 6 x 17 cm.  Calcified small gallstones are present.  No masses identified within the gallbladder.  Moderate to large ascites has increased in the interval.  No subcapsular hematoma is identified.  There are findings compatible with cirrhosis with irregular contour of the liver and enlargement of the caudate lobe.  This is unchanged from the  prior study. Probable portal hypertension is present.  The pancreas is normal.  Spleen is normal in size.  Nonobstructing left renal stone.  No hydronephrosis or renal mass.  Left renal cyst complex cyst is noted.  Negative for bowel obstruction.  Sigmoid diverticulosis.  Gastric banding procedure has been performed with gastric band in satisfactory position.  Moderate to severe compression fracture of L1 is unchanged from prior study.  Diffuse aortic atherosclerotic disease is present without aneurysm.  IMPRESSION: Infiltrative mass in the liver has been recently biopsied showing carcinoma.  This is unchanged.  No evidence of subcapsular hematoma.  Moderate to large amount of ascites, with increased from the recent CT of 02/16/2012.  Cirrhosis and portal hypertension.  Normal appearing pancreas. Gallstones  Original Report Authenticated By: Camelia Phenes, M.D.   Ct Abdomen Pelvis W Contrast  02/16/2012  *RADIOLOGY REPORT*  Clinical Data: Abdominal distension and pain.  CT ABDOMEN AND PELVIS WITH CONTRAST  Technique:  Multidetector CT imaging of the abdomen and pelvis was performed following the standard protocol during bolus administration of  intravenous contrast.  Contrast: OMNIPAQUE IOHEXOL 300 MG/ML  SOLN  Comparison: No priors.  Findings:  Lung Bases: Scarring or subsegmental atelectasis in the left lower lobe. There is atherosclerosis of the thoracic aorta and the coronary arteries, including calcified atherosclerotic plaque in the the left main, left anterior descending, left circumflex and right coronary arteries.  Abdomen/Pelvis:  There is a diffusely infiltrative mass in the liver involving segments II, III, IVa/IVb, V and VIII. This mass is heterogeneous in appearance with multifocal areas of low attenuation and heterogeneous enhancement.  The liver generally has a slightly shrunken  and nodular contour in the area of the mass, but there appears to be some mild hypertrophy of the right lobe and definite hypertrophy of the caudate lobe.  Numerous gallstones are noted dependently within the lumen of the gallbladder.  The gallbladder wall appears mildly thickened, and there is a small amount of pericholecystic fluid, however, the overall appearance is not strongly suggestive of acute cholecystitis (findings are compounded by the presence of the surrounding ascites).  The appearance of the pancreas, spleen and bilateral adrenal glands is unremarkable.  Mild parenchymal thinning is noted at multiple regions in the kidneys bilaterally, which likely reflects some scarring.  2.2 cm exophytic cyst in the interpolar region of the left kidney.  There is a densely calcified lesion extending off the upper pole of the left kidney.  A nonobstructive 6 mm calculus is noted in the interpolar collecting system of the left kidney.  Moderate - large volume of ascites. Extensive atherosclerosis throughout the abdominal and pelvic vasculature, without definite aneurysm or dissection.  Status post lap band procedure.  Numerous colonic diverticulae.  Given the presence of the ascites, accurate assessment for signs of acute colonic diverticulitis is not possible  on today's examination.  Numerous dilated perianal and perirectal veins, compatible with hemorrhoids.  Urinary bladder is unremarkable.  A penile prosthesis is noted with a reservoir in the lower left anatomic pelvis.  Musculoskeletal: Diffuse body wall edema There are no aggressive appearing lytic or blastic lesions noted in the visualized portions of the skeleton.  Compression fracture at L1 with approximately 50% loss of anterior vertebral body height has an appearance suggestive of old injury.  IMPRESSION: 1.  The appearance of the liver is highly concerning for an infiltrative neoplasm, and given and the findings of cirrhosis, is most concerning for a diffusely infiltrative hepatocellular carcinoma. 2.  Moderate - large volume of ascites. 3.  Cholelithiasis.  Accurate assessment for acute cholecystitis is not possible on this examination given the adjacent ascites. 4.  Colonic diverticulosis.  Accurate assessment for acute diverticulitis is not possible on this examination given the presence of the ascites. 5. Atherosclerosis, including left main and three-vessel coronary artery disease. 6.  Additional incidental findings, as above.  Original Report Authenticated By: Florencia Reasons, M.D.   US Biopsy  02/21/2012  *RADIOLOGY REPORT*  Indication: Infiltrative hepatic mass adjacent to the gallbladder fossa  ULTRASOUND GUIDED LIVER LESION BIOPSY  Comparison: CT abdomen pelvis - 02/16/2012; abdominal ultrasound - 02/16/2012; ultrasound guided paracentesis - 02/18/2012  Intravenous medications: Fentanyl 100 mcg IV; Versed 1 mg IV  Total Moderate Sedation time: 15 minutes  Complications: None immediate  Procedure:  Informed written consent was obtained from the patient after a discussion of the risks, benefits and alternatives to treatment. The patient understands and consents the procedure.  A timeout was performed prior to the initiation of the procedure.  Ultrasound scanning was performed of the right upper  abdominal quadrant demonstrates ill-defined increased heterogeneous echotexture of the gallbladder fossa which correlates with the infiltrating mass seen on preprocedural abdominal CT.  A small amount of ascites remains following recent ultrasound guided paracentesis.  The segment of the infiltrative mass within the medial aspect of the left lobe of the liver adjacent to the gallbladder fossa was selected for biopsy.  The procedure was planned.  The right upper abdominal quadrant was prepped and draped in the usual sterile fashion.  The overlying soft tissues were anesthetized with 1% lidocaine with epinephrine.  A 17 gauge, 6.8 cm  co-axial needle was advanced into a peripheral aspect of the infiltrative mass and 2 fine needle aspirates were obtained with a Francine needle.  Quick stain pathologic review demonstrated scattered atypical cells. This was followed by the acquisition of 3 core biopsies with an 18 gauge core device under direct ultrasound guidance.  As there was a small amount of back bleeding from the coaxial needle, and given the presence of a minimal amount of ascites, the tract was embolized with a small amount of Gelfoam.  The co-axial needle was removed and hemostasis was obtained with manual compression.  Post procedural scanning was negative for definitive area of hemorrhage or additional complication.  A dressing was placed.  The patient tolerated the procedure well without immediate post procedural complication.  Impression:  Technically successful ultrasound guided fine needle aspiration and core needle biopsy of the infiltrative mass about the gallbladder fossa involving the medial segment of the left lobe of the liver  Original Report Authenticated By: Waynard Reeds, M.D.   US Paracentesis  02/18/2012  *RADIOLOGY REPORT*  Clinical Data: Prior history of bladder carcinoma; now with infiltrative liver tumor and ascites; request is made for diagnostic and therapeutic paracentesis.   ULTRASOUND GUIDED DIAGNOSTIC AND THERAPEUTIC  PARACENTESIS  An ultrasound guided paracentesis was thoroughly discussed with the patient and questions answered.  The benefits, risks, alternatives and complications were also discussed.  The patient understands and wishes to proceed with the procedure.  Written consent was obtained.  Ultrasound was performed to localize and mark an adequate pocket of fluid in the right upper to mid quadrant of the abdomen.  The area was then prepped and draped in the normal sterile fashion.  1% Lidocaine was used for local anesthesia.  Under ultrasound guidance a 19 gauge Yueh catheter was introduced.  Paracentesis was performed.  The catheter was removed and a dressing applied.  Complications:  none  Findings:  A total of approximately 2.8 liters of yellow fluid was removed.  A fluid sample was sent for laboratory analysis.  IMPRESSION: Successful ultrasound guided diagnostic and therapeutic paracentesis yielding 2.8 liters of ascites.  Read by: Jeananne Rama, P.A.-C  Original Report Authenticated By: Waynard Reeds, M.D.   Dg Abd 2 Views  02/16/2012  *RADIOLOGY REPORT*  Clinical Data: Abdominal bloating, history of lap band  ABDOMEN - 2 VIEW  Comparison: Lumbar spine films of 01/18/2011  Findings: Although the orientation of the lap band is rather vertical, this does not appear to have changed when compared to the prior lumbar spine view from July 2012.  No free air is seen on the erect view.  However, on the supine views there is dilatation of a loop of small bowel in the left abdomen up to 4 cm.  This is worrisome for developing partial small bowel obstruction.  If further assessment is warranted CT of the abdomen pelvis with IV contrast media is recommended.  No colonic distention is seen.  The bones are osteopenic and there are diffuse degenerative changes throughout the lumbar spine.  IMPRESSION:  1. Possible developing partial small bowel obstruction.  Consider CT abdomen  and pelvis with IV contrast to assess further. 2.  No free air. 3.  No change in somewhat vertical orientation of the lap band.  Original Report Authenticated By: Juline Patch, M.D.      Microbiology: No results found for this or any previous visit (from the past 240 hour(s)).   Labs: Results for orders placed during  the hospital encounter of 02/26/12 (from the past 48 hour(s))  AMMONIA     Status: Abnormal   Collection Time   02/26/12  8:45 PM      Component Value Range Comment   Ammonia 95 (*) 11 - 60 umol/L   CBC WITH DIFFERENTIAL     Status: Abnormal   Collection Time   02/26/12  8:45 PM      Component Value Range Comment   WBC 9.3  4.0 - 10.5 K/uL    RBC 4.13 (*) 4.22 - 5.81 MIL/uL    Hemoglobin 15.2  13.0 - 17.0 g/dL    HCT 16.1  09.6 - 04.5 %    MCV 104.8 (*) 78.0 - 100.0 fL    MCH 36.8 (*) 26.0 - 34.0 pg    MCHC 35.1  30.0 - 36.0 g/dL    RDW 40.9 (*) 81.1 - 15.5 %    Platelets 152  150 - 400 K/uL    Neutrophils Relative 65  43 - 77 %    Neutro Abs 6.0  1.7 - 7.7 K/uL    Lymphocytes Relative 20  12 - 46 %    Lymphs Abs 1.9  0.7 - 4.0 K/uL    Monocytes Relative 14 (*) 3 - 12 %    Monocytes Absolute 1.3 (*) 0.1 - 1.0 K/uL    Eosinophils Relative 1  0 - 5 %    Eosinophils Absolute 0.1  0.0 - 0.7 K/uL    Basophils Relative 0  0 - 1 %    Basophils Absolute 0.0  0.0 - 0.1 K/uL   COMPREHENSIVE METABOLIC PANEL     Status: Abnormal   Collection Time   02/26/12  8:45 PM      Component Value Range Comment   Sodium 137  135 - 145 mEq/L    Potassium 4.1  3.5 - 5.1 mEq/L    Chloride 102  96 - 112 mEq/L    CO2 26  19 - 32 mEq/L    Glucose, Bld 88  70 - 99 mg/dL    BUN 48 (*) 6 - 23 mg/dL    Creatinine, Ser 9.14 (*) 0.50 - 1.35 mg/dL    Calcium 9.3  8.4 - 78.2 mg/dL    Total Protein 6.8  6.0 - 8.3 g/dL    Albumin 2.6 (*) 3.5 - 5.2 g/dL    AST 956 (*) 0 - 37 U/L    ALT 98 (*) 0 - 53 U/L    Alkaline Phosphatase 298 (*) 39 - 117 U/L    Total Bilirubin 2.1 (*) 0.3 - 1.2 mg/dL     GFR calc non Af Amer 46 (*) >90 mL/min    GFR calc Af Amer 54 (*) >90 mL/min   LIPASE, BLOOD     Status: Abnormal   Collection Time   02/26/12  8:45 PM      Component Value Range Comment   Lipase 220 (*) 11 - 59 U/L   URINALYSIS, ROUTINE W REFLEX MICROSCOPIC     Status: Normal   Collection Time   02/26/12  8:56 PM      Component Value Range Comment   Color, Urine YELLOW  YELLOW    APPearance CLEAR  CLEAR    Specific Gravity, Urine 1.013  1.005 - 1.030    pH 5.5  5.0 - 8.0    Glucose, UA NEGATIVE  NEGATIVE mg/dL    Hgb urine dipstick NEGATIVE  NEGATIVE    Bilirubin Urine  NEGATIVE  NEGATIVE    Ketones, ur NEGATIVE  NEGATIVE mg/dL    Protein, ur NEGATIVE  NEGATIVE mg/dL    Urobilinogen, UA 1.0  0.0 - 1.0 mg/dL    Nitrite NEGATIVE  NEGATIVE    Leukocytes, UA NEGATIVE  NEGATIVE MICROSCOPIC NOT DONE ON URINES WITH NEGATIVE PROTEIN, BLOOD, LEUKOCYTES, NITRITE, OR GLUCOSE <1000 mg/dL.  AMMONIA     Status: Abnormal   Collection Time   02/27/12  4:54 AM      Component Value Range Comment   Ammonia 94 (*) 11 - 60 umol/L   VITAMIN B12     Status: Abnormal   Collection Time   02/27/12  4:54 AM      Component Value Range Comment   Vitamin B-12 1696 (*) 211 - 911 pg/mL   SEDIMENTATION RATE     Status: Abnormal   Collection Time   02/27/12  4:54 AM      Component Value Range Comment   Sed Rate 44 (*) 0 - 16 mm/hr   RPR     Status: Normal   Collection Time   02/27/12  4:54 AM      Component Value Range Comment   RPR NON REACTIVE  NON REACTIVE   TSH     Status: Normal   Collection Time   02/27/12  4:54 AM      Component Value Range Comment   TSH 4.460  0.350 - 4.500 uIU/mL   COMPREHENSIVE METABOLIC PANEL     Status: Abnormal   Collection Time   02/27/12  4:54 AM      Component Value Range Comment   Sodium 136  135 - 145 mEq/L    Potassium 4.2  3.5 - 5.1 mEq/L    Chloride 102  96 - 112 mEq/L    CO2 25  19 - 32 mEq/L    Glucose, Bld 82  70 - 99 mg/dL    BUN 48 (*) 6 - 23 mg/dL     Creatinine, Ser 1.61 (*) 0.50 - 1.35 mg/dL    Calcium 8.9  8.4 - 09.6 mg/dL    Total Protein 6.1  6.0 - 8.3 g/dL    Albumin 2.4 (*) 3.5 - 5.2 g/dL    AST 045 (*) 0 - 37 U/L    ALT 84 (*) 0 - 53 U/L    Alkaline Phosphatase 269 (*) 39 - 117 U/L    Total Bilirubin 1.9 (*) 0.3 - 1.2 mg/dL    GFR calc non Af Amer 41 (*) >90 mL/min    GFR calc Af Amer 47 (*) >90 mL/min   CBC     Status: Abnormal   Collection Time   02/27/12  4:54 AM      Component Value Range Comment   WBC 9.0  4.0 - 10.5 K/uL    RBC 3.90 (*) 4.22 - 5.81 MIL/uL    Hemoglobin 14.1  13.0 - 17.0 g/dL    HCT 40.9  81.1 - 91.4 %    MCV 106.7 (*) 78.0 - 100.0 fL    MCH 36.2 (*) 26.0 - 34.0 pg    MCHC 33.9  30.0 - 36.0 g/dL    RDW 78.2 (*) 95.6 - 15.5 %    Platelets 139 (*) 150 - 400 K/uL   DIFFERENTIAL     Status: Abnormal   Collection Time   02/27/12  4:54 AM      Component Value Range Comment   Neutrophils Relative 60  43 -  77 %    Neutro Abs 5.4  1.7 - 7.7 K/uL    Lymphocytes Relative 25  12 - 46 %    Lymphs Abs 2.3  0.7 - 4.0 K/uL    Monocytes Relative 14 (*) 3 - 12 %    Monocytes Absolute 1.3 (*) 0.1 - 1.0 K/uL    Eosinophils Relative 1  0 - 5 %    Eosinophils Absolute 0.1  0.0 - 0.7 K/uL    Basophils Relative 0  0 - 1 %    Basophils Absolute 0.0  0.0 - 0.1 K/uL   HEMOGLOBIN A1C     Status: Normal   Collection Time   02/27/12  4:54 AM      Component Value Range Comment   Hemoglobin A1C 5.5  <5.7 %    Mean Plasma Glucose 111  <117 mg/dL   GLUCOSE, CAPILLARY     Status: Normal   Collection Time   02/27/12  7:51 AM      Component Value Range Comment   Glucose-Capillary 78  70 - 99 mg/dL   GLUCOSE, CAPILLARY     Status: Normal   Collection Time   02/27/12 12:13 PM      Component Value Range Comment   Glucose-Capillary 87  70 - 99 mg/dL      HPI :78 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 Pt is having  difficulty walking to the bathroom due to weakness. Pt has made determination per his wife and he agrees that he doesn't want therapy for his cholangiocarcinoma. Pt has had abdominal pain this afternoon intermittently. Denies fever, chills, cp, palp, sob, n/v, constipation, brbpr, black stool. Last bm yesterday afternoon. Loose stool. Pt will be admitted for FTT, generalized weakness, altered ms.    HOSPITAL COURSE:  Altered ms: Likely secondary to hepatic encephalopathy, Lactulose was started and will be continued at noncontrast CT of the brain was negative   Cholangiocarcinoma :rapid decline and weakness over past 2 months, wt loss per family, abdominal distention. Hypoalbuminemia, likely hepatic/ cholangiocarcinoma ca, patient requested palliative care measures only. A palliative care consultation was done. Patient has been set up with hospice and palliative care of [facility]. All bed table and oxygen provided  Goals of Care:  1. Code Status:DNR/DNI  2. Scope of Treatment:  1. Focus of care is full comfort  - no further diagnostics, no treatment desired for cancer diagnosis  -no invasive interventions  -no artificial feeding or hydration now or in the future, foods and fluids as tolerated  - no antibiotics  -minimize meds only for comfort  - O2 for comfort if necessary  -leave foley for comfort  4. Disposition: Transfer to Palliative Care unit and then home with hospice once in place  Ascites: Ascites secondary to #2, continue diuretics using spironolactone and Lasix. Patient also advised to follow a low-sodium diet and to restrict the amount of proteins of his right lower diet. At this moment he is no short of breath, and even in his belly is distended and with positive wave fluid on exam do not require another paracentesis at this moment;    Discharge Exam:  Blood pressure 109/67, pulse 59, temperature 98.1 F (36.7 C), temperature source Oral, resp. rate 18, height 5\' 11"  (1.803  m), weight 95.8 kg (211 lb 3.2 oz), SpO2 94.00%.  General: Ill appearing NAD  HEENT: moist mucous membranes, + temporal muscle wasting  Chest: diminished in bases  CVS: RRR  Abdomen: distended, minimal tenderness on palpation  Ext: trace edema  Neuro:alert and oriented X3    Discharge Orders    Future Appointments: Provider: Department: Dept Phone: Center:   02/29/2012 3:00 PM [facility] [phone] None   02/29/2012 3:30 PM Laurice Record, MD [facility] [phone] None        Signed: Richarda Overlie 02/28/2012, 11:10 AM

## 2012-02-28 NOTE — Progress Notes (Signed)
PHYSICAL THERAPY DISCONTINU NOTE-- Acute PT is not indicated. Pt is comfort care and plans Home Hospice. Clydie Braun [place]PT [phone]

## 2012-02-28 NOTE — Progress Notes (Signed)
Reviewed AVS and discharge instructions with wife.  She verbalized understanding.  Will contact [facility] for transport.

## 2012-02-28 NOTE — Consult Note (Signed)
I have reviewed and discussed the care of this patient in detail with the nurse practitioner including pertinent patient records, physical exam findings and data. I agree with details of this encounter.  

## 2012-02-29 ENCOUNTER — Ambulatory Visit: Payer: Medicare Other

## 2012-02-29 ENCOUNTER — Inpatient Hospital Stay: Payer: Medicare Other | Admitting: Hematology and Oncology

## 2012-04-10 DEATH — deceased

## 2012-09-07 ENCOUNTER — Telehealth: Payer: Self-pay | Admitting: Internal Medicine

## 2012-09-07 NOTE — Telephone Encounter (Signed)
09-07-12 pt past due on pacer ck, needs to see klein or brooke if klein booked out, unable to reach by phone, sent pt email/mt

## 2012-12-07 ENCOUNTER — Encounter: Payer: Self-pay | Admitting: Internal Medicine

## 2012-12-07 ENCOUNTER — Telehealth: Payer: Self-pay | Admitting: Internal Medicine

## 2012-12-07 NOTE — Telephone Encounter (Signed)
12-07-12 sent past due letter certified/mt °

## 2012-12-25 ENCOUNTER — Telehealth: Payer: Self-pay | Admitting: Internal Medicine

## 2012-12-25 NOTE — Telephone Encounter (Signed)
12-25-12 certified letter signed by wife and returned/mt

## 2013-01-07 ENCOUNTER — Telehealth: Payer: Self-pay | Admitting: Internal Medicine

## 2013-01-07 NOTE — Telephone Encounter (Signed)
01/26/2013 pt deceased per wife 03/2012/mt

## 2013-10-10 IMAGING — CR DG ABDOMEN 2V
3 series · 3 of 3 positions shown · non-contrast
Comparison: Lumbar spine films of 01/18/2011

CLINICAL DATA: Abdominal bloating, history of lap band

ABDOMEN - 2 VIEW

[w abdomen upright]
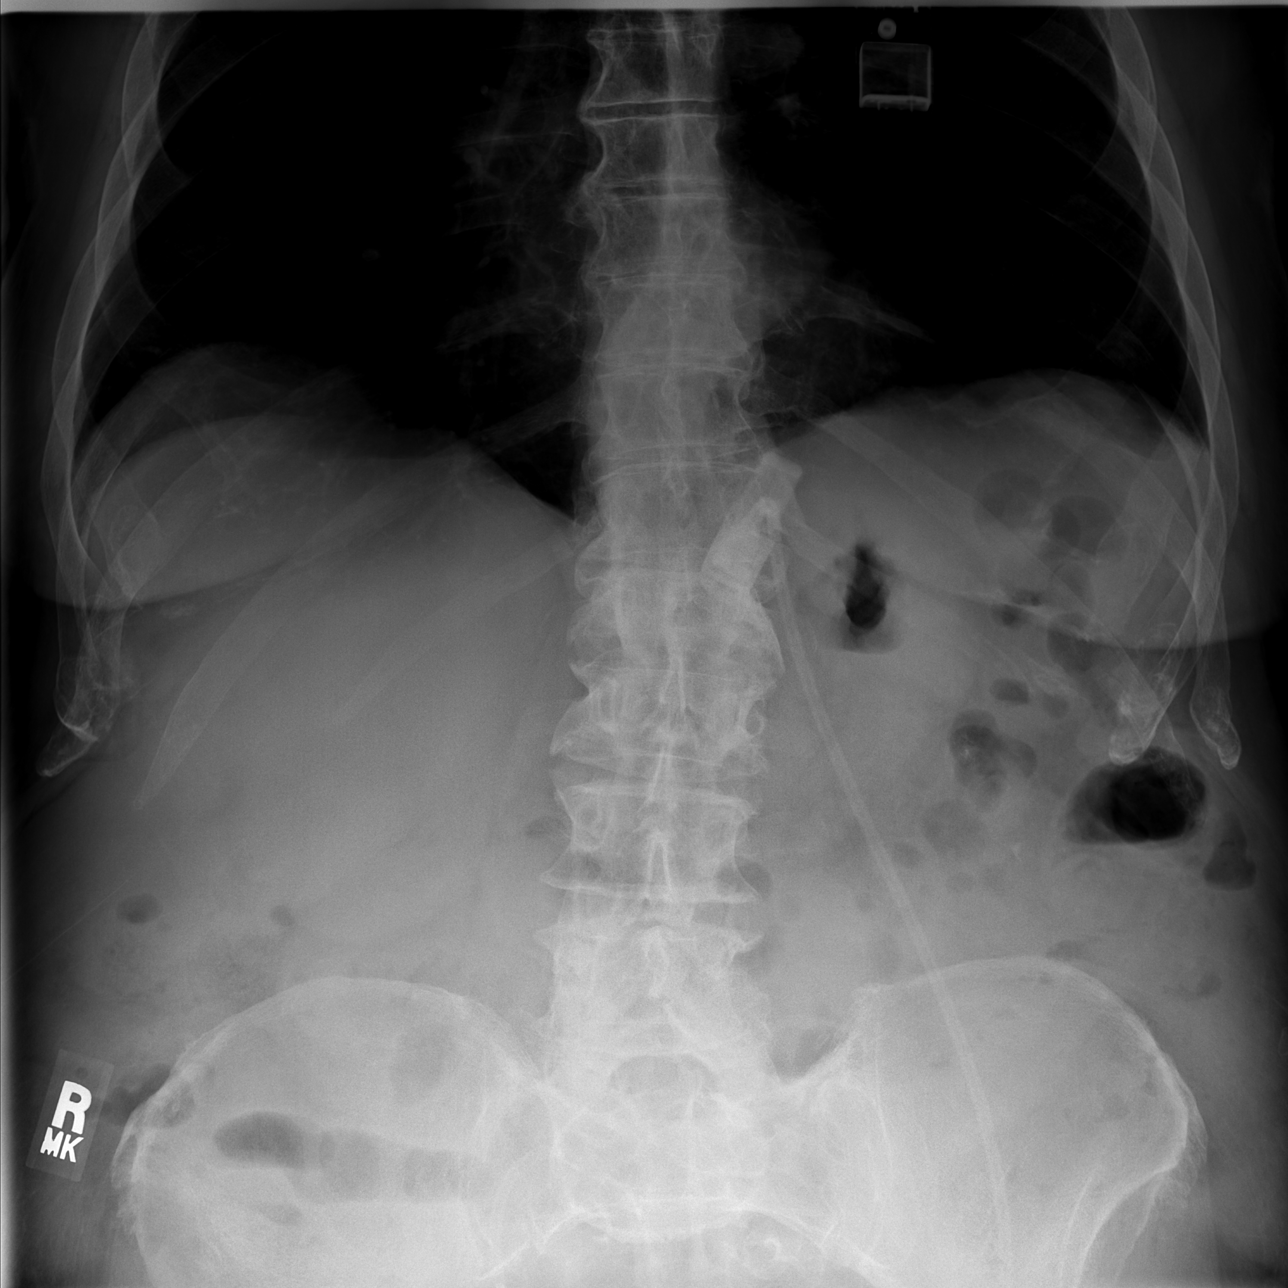

[t abdomen supine (1 of 2)]
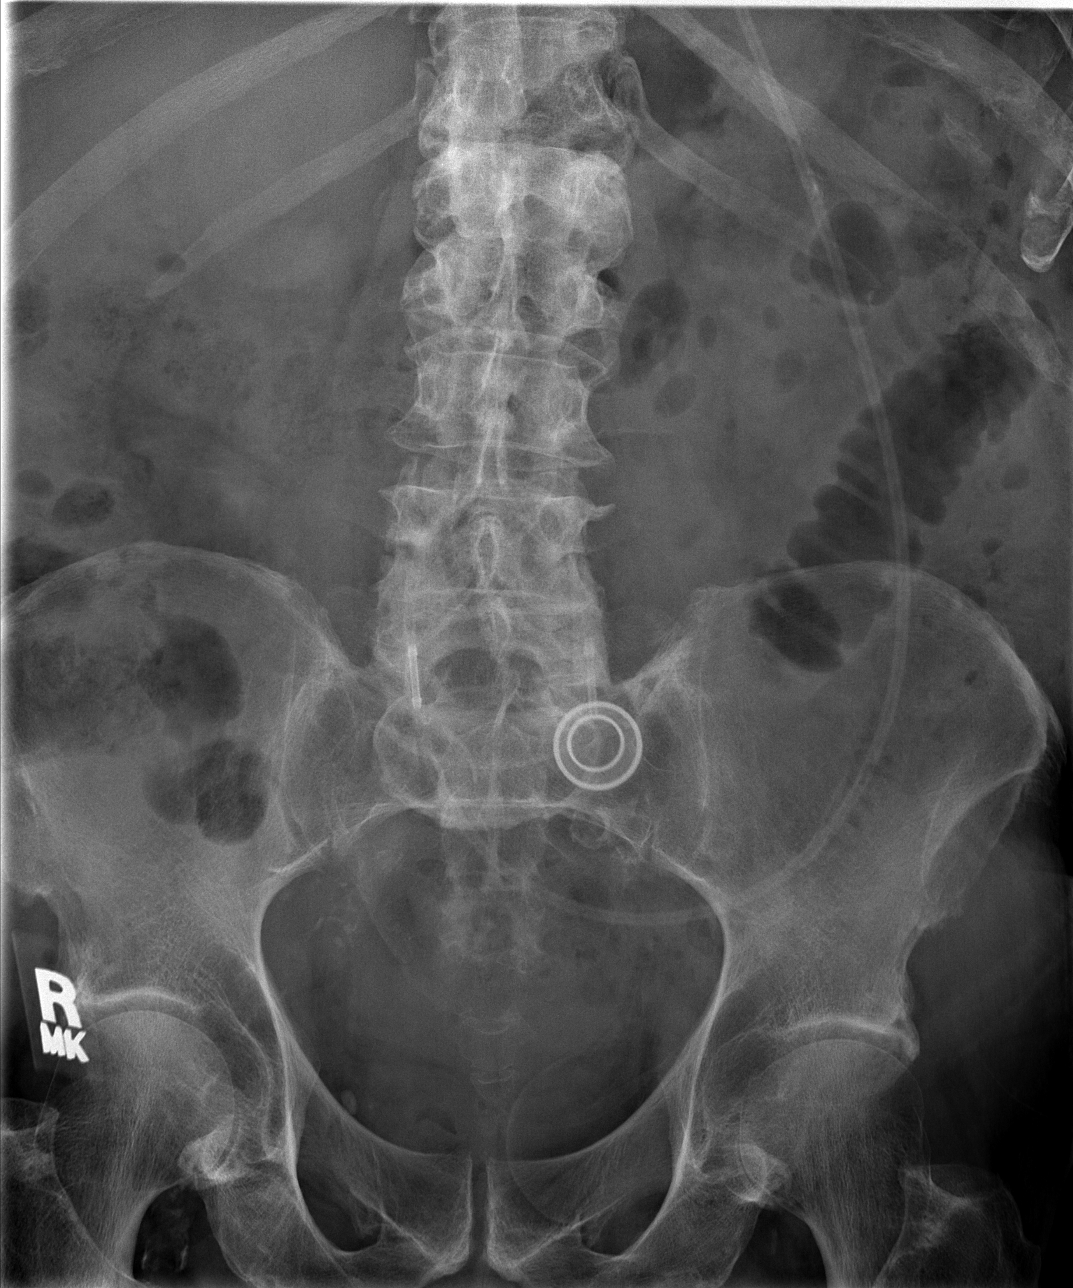

[t abdomen supine (2 of 2)]
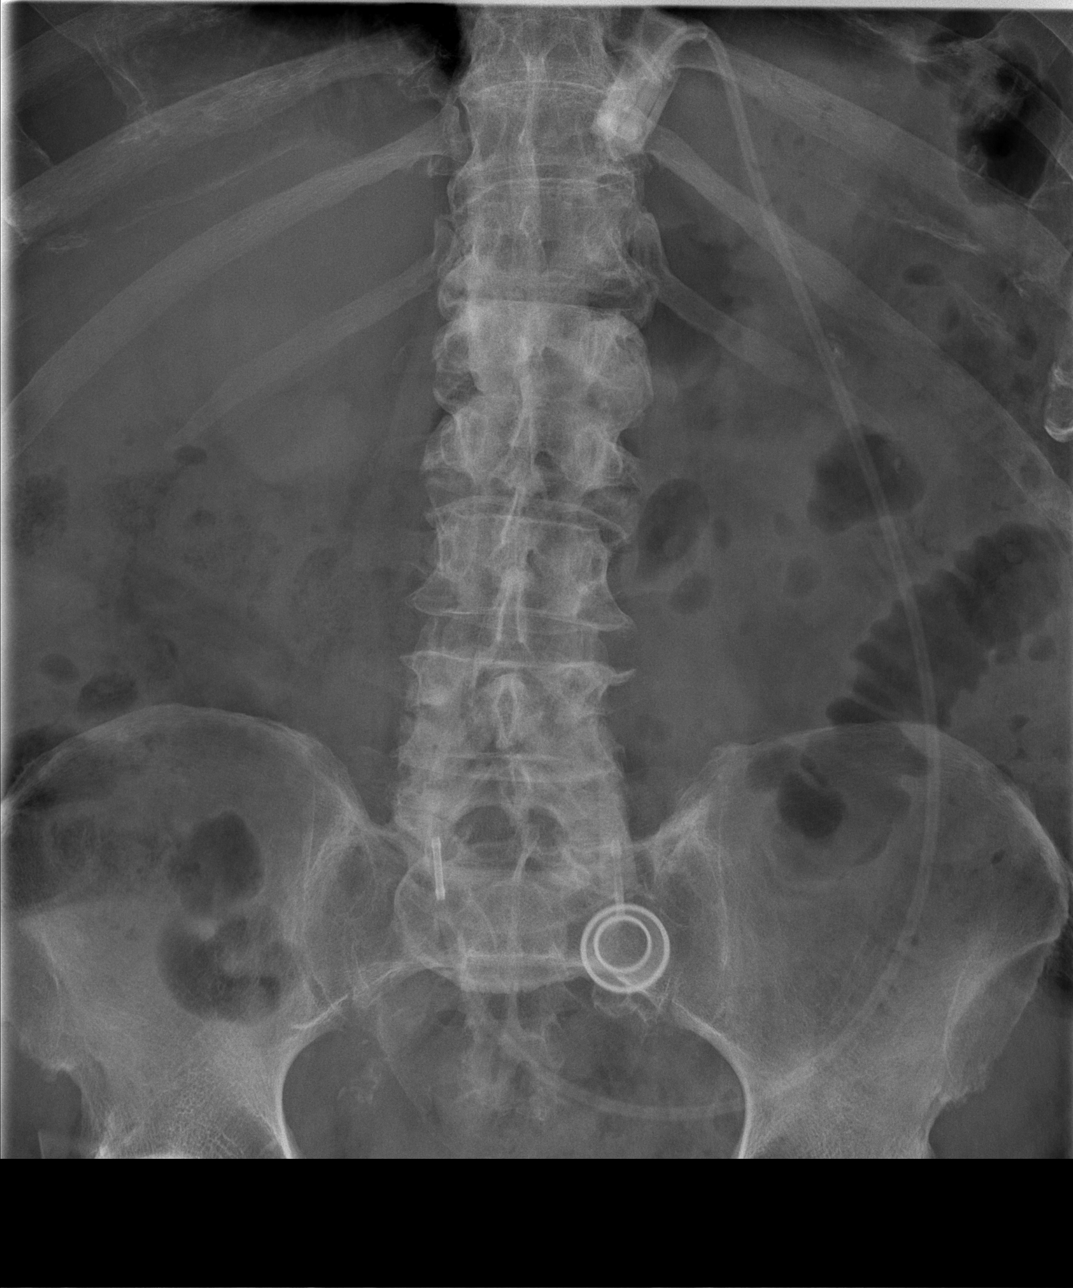

[3 of 3 positions shown; findings below may reference images not displayed]

FINDINGS: Although the orientation of the lap band is rather
vertical, this does not appear to have changed when compared to the
prior lumbar spine view from January 2011.  No free air is seen on the
erect view.  However, on the supine views there is dilatation of a
loop of small bowel in the left abdomen up to 4 cm.  This is
worrisome for developing partial small bowel obstruction.  If
further assessment is warranted CT of the abdomen pelvis with IV
contrast media is recommended.  No colonic distention is seen.  The
bones are osteopenic and there are diffuse degenerative changes
throughout the lumbar spine.
IMPRESSION: 1. Possible developing partial small bowel obstruction.  Consider
CT abdomen and pelvis with IV contrast to assess further.
2.  No free air.
3.  No change in somewhat vertical orientation of the lap band.

## 2013-10-12 IMAGING — US US PARACENTESIS
1 series · 8 of 8 positions shown · non-contrast
Comparison: none

CLINICAL DATA: Prior history of bladder carcinoma; now with
infiltrative liver tumor and ascites; request is made for
diagnostic and therapeutic paracentesis.

[Series 1: us paracentesis · 0.35mm/px · 8 of 8 slices shown]
[im 1/8]
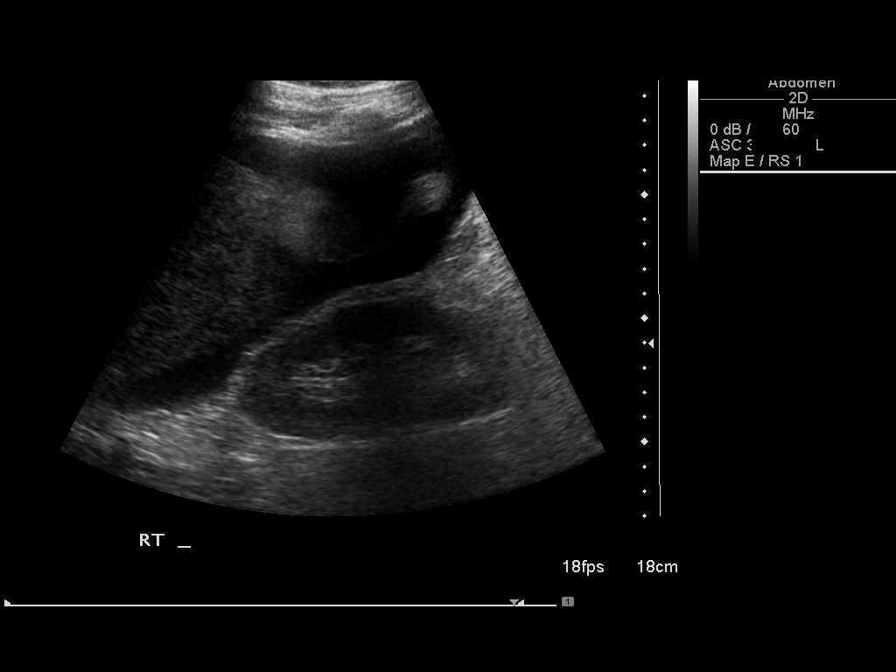
[im 2/8]
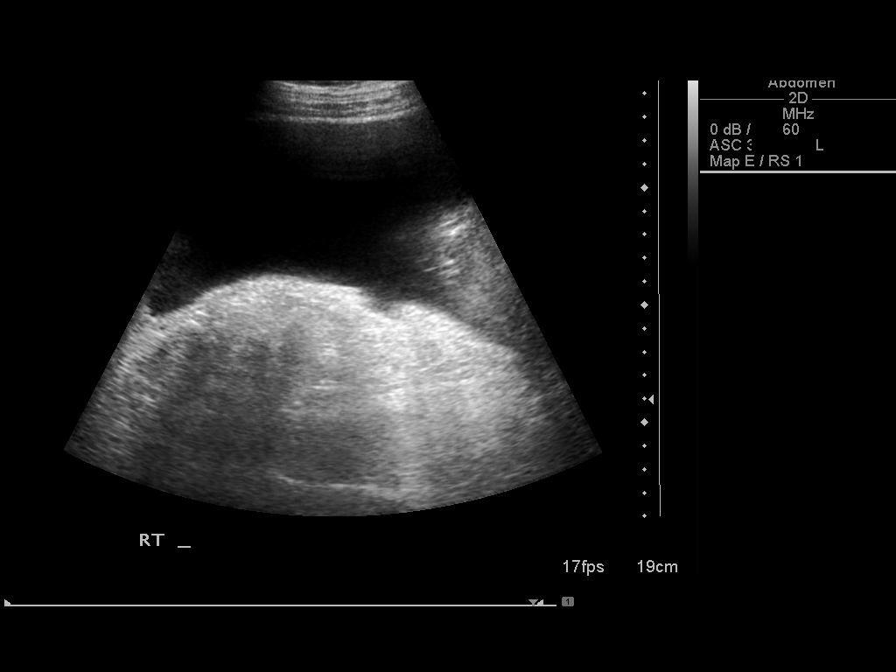
[im 3/8]
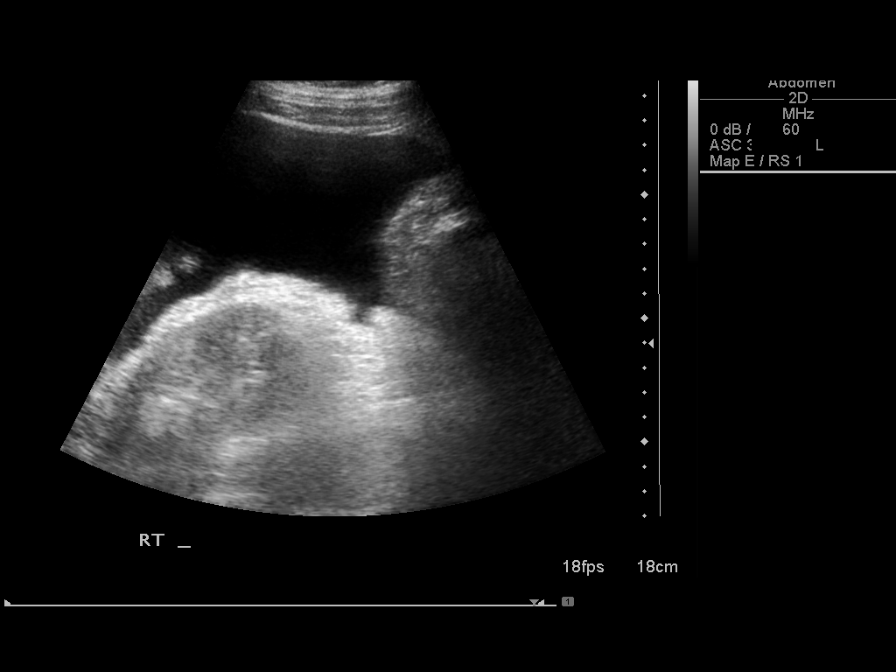
[im 4/8]
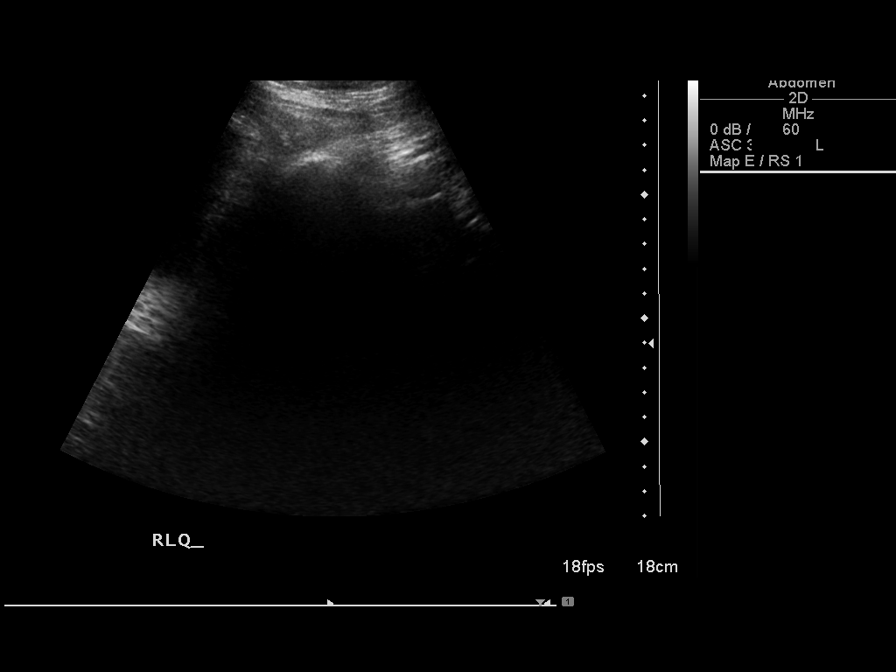
[im 5/8]
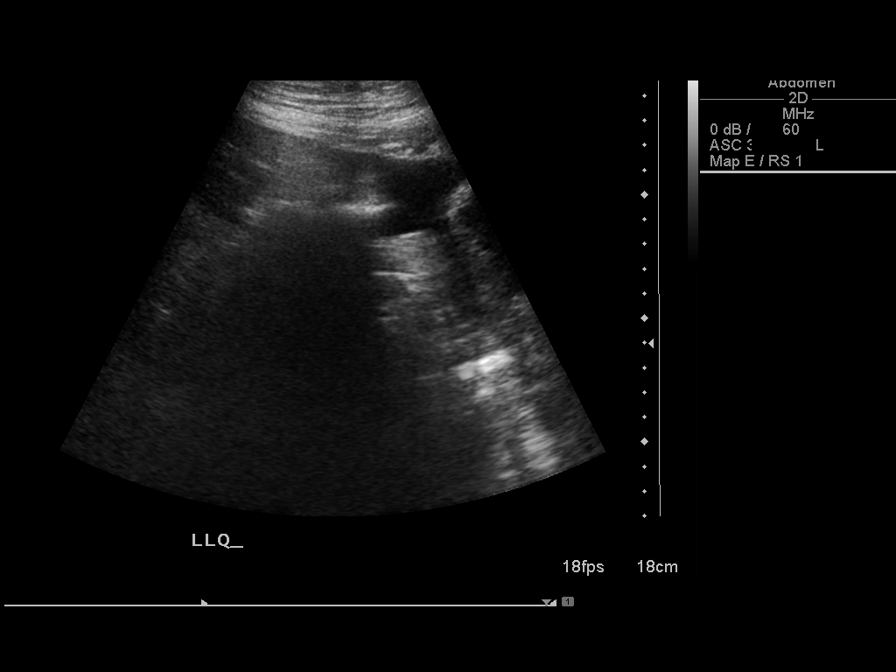
[im 6/8]
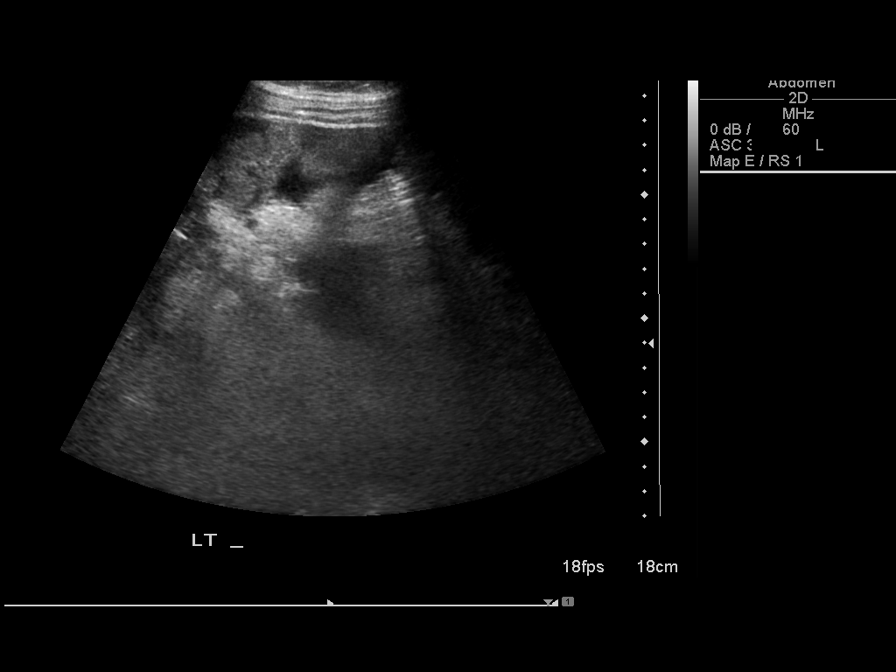
[im 7/8]
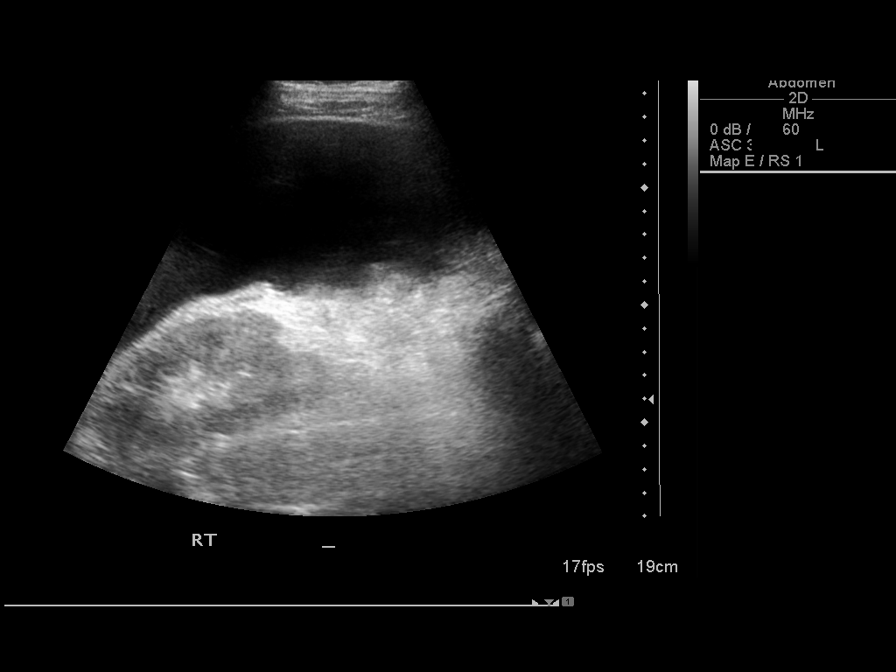
[im 8/8]
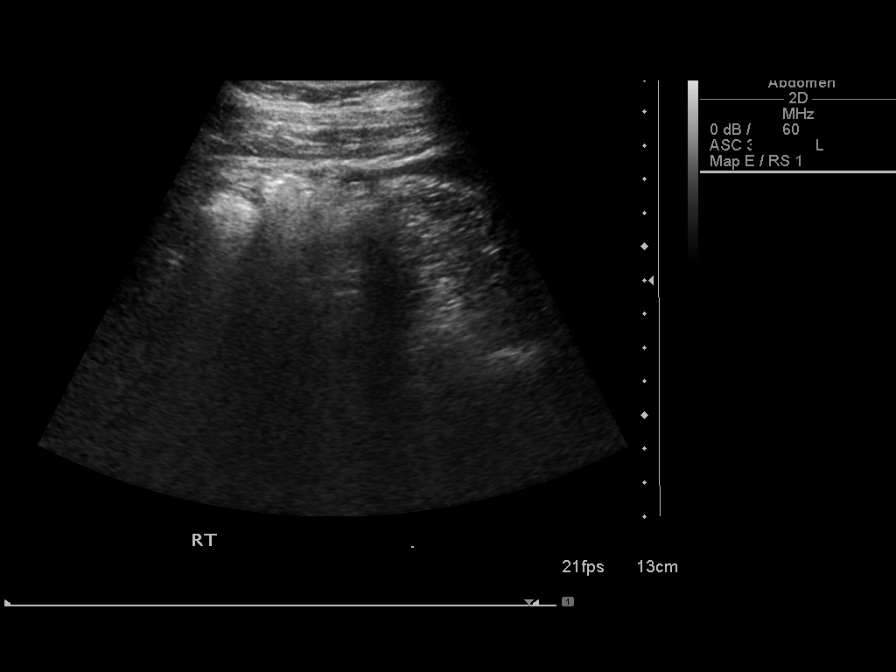

[8 of 8 positions shown; findings below may reference images not displayed]

ULTRASOUND GUIDED DIAGNOSTIC AND THERAPEUTIC  PARACENTESIS

An ultrasound guided paracentesis was thoroughly discussed with the
patient and questions answered.  The benefits, risks, alternatives
and complications were also discussed.  The patient understands and
wishes to proceed with the procedure.  Written consent was
obtained.

Ultrasound was performed to localize and mark an adequate pocket of
fluid in the right upper to mid quadrant of the abdomen.  The area
was then prepped and draped in the normal sterile fashion.  1%
Lidocaine was used for local anesthesia.  Under ultrasound guidance
a 19 gauge Yueh catheter was introduced.  Paracentesis was
performed.  The catheter was removed and a dressing applied.

Complications:  none
FINDINGS: A total of approximately 2.8 liters of yellow fluid was
removed.  A fluid sample was sent for laboratory analysis.
IMPRESSION: Successful ultrasound guided diagnostic and therapeutic
paracentesis yielding 2.8 liters of ascites.

Read by: Yamura, Toshiya.-KRAVITZ

## 2013-10-15 IMAGING — US US BIOPSY
1 series · 10 of 10 positions shown · non-contrast
Comparison: CT abdomen pelvis - 02/16/2012;

INDICATION: Infiltrative hepatic mass adjacent to the gallbladder
fossa

ULTRASOUND GUIDED LIVER LESION BIOPSY

[Series 1: us biopsy · 0.30mm/px · 10 of 10 slices shown]
[im 1/10]
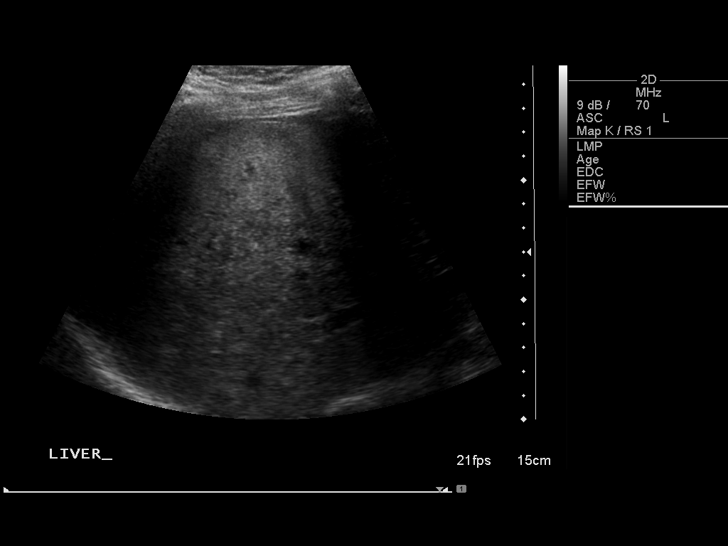
[im 2/10]
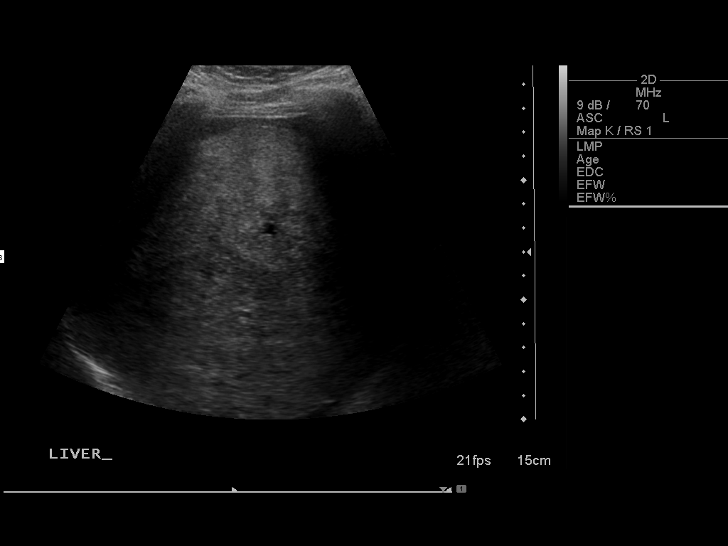
[im 3/10]
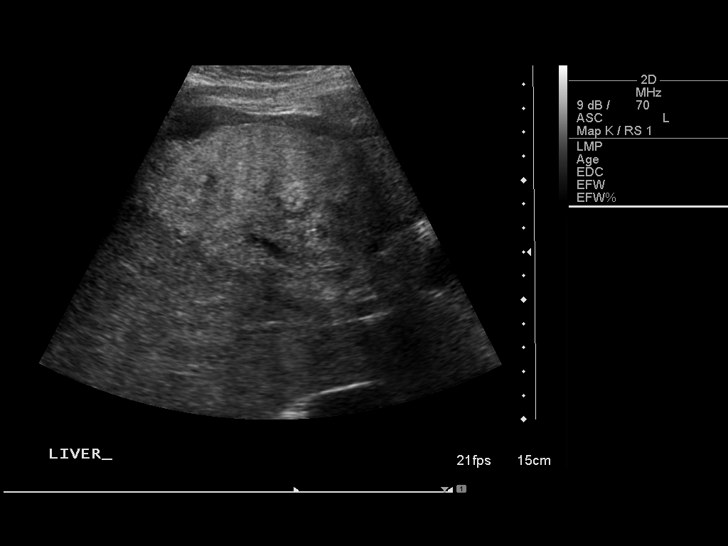
[im 4/10]
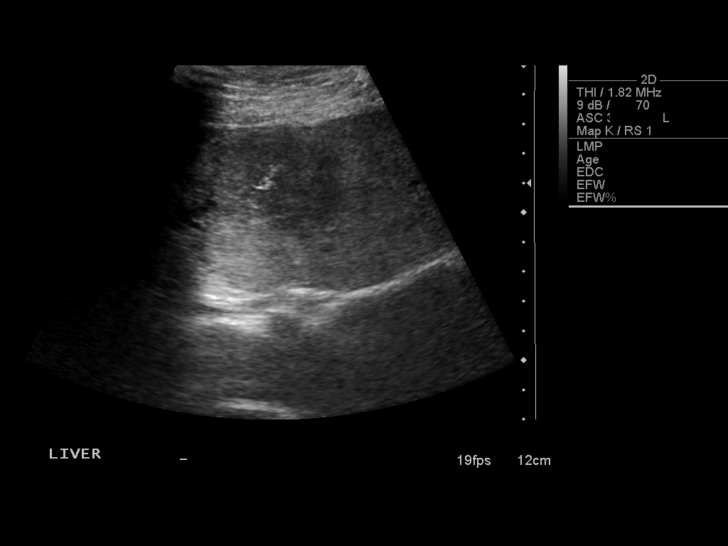
[im 5/10]
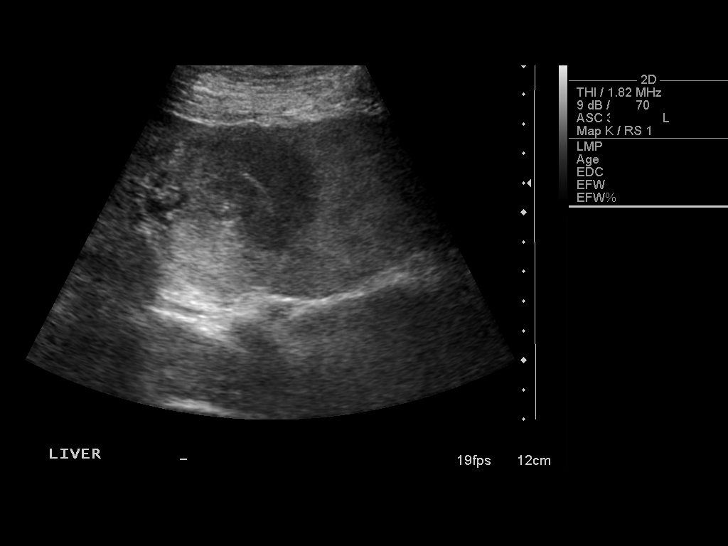
[im 6/10]
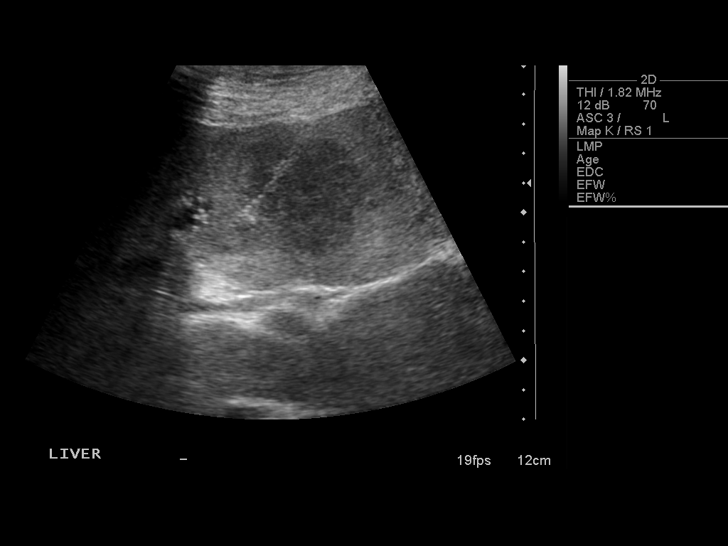
[im 7/10]
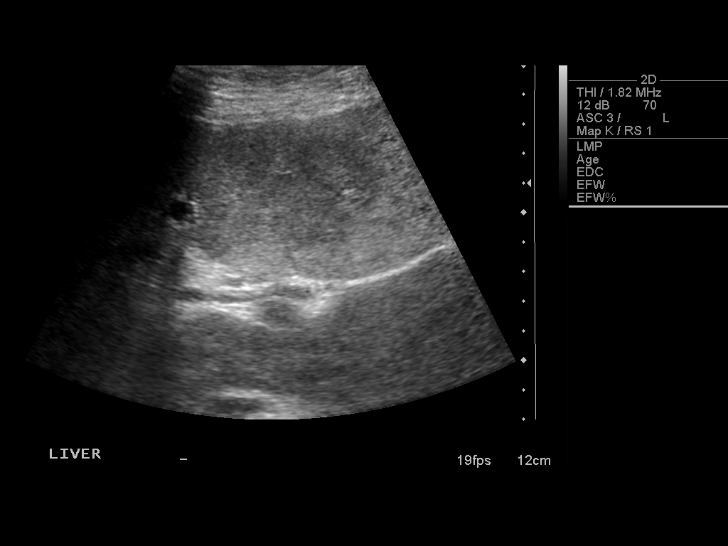
[im 8/10]
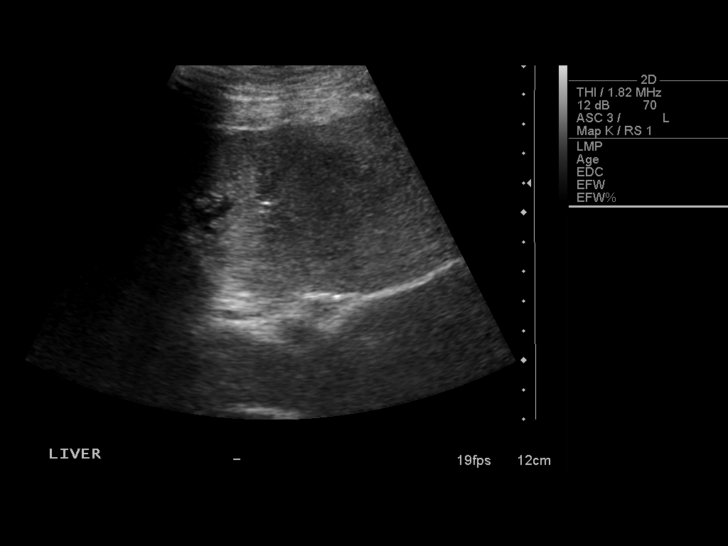
[im 9/10]
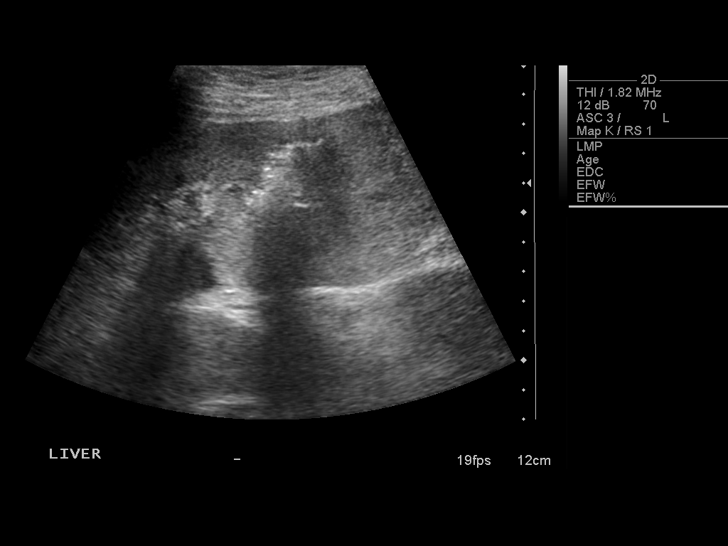
[im 10/10]
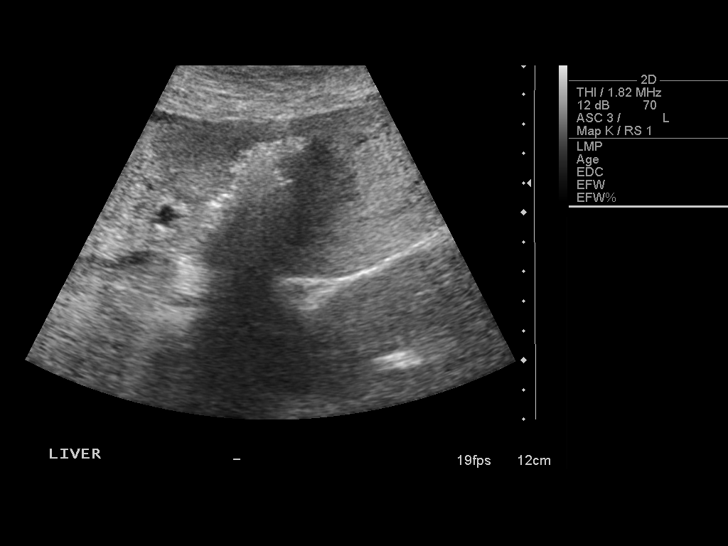

[10 of 10 positions shown; findings below may reference images not displayed]

abdominal ultrasound -
02/16/2012; ultrasound guided paracentesis - 02/18/2012

Intravenous medications: Fentanyl 100 mcg IV; Versed 1 mg IV

Total Moderate Sedation time: 15 minutes

Complications: None immediate

Procedure:

Informed written consent was obtained from the patient after a
discussion of the risks, benefits and alternatives to treatment.
The patient understands and consents the procedure.  A timeout was
performed prior to the initiation of the procedure.

Ultrasound scanning was performed of the right upper abdominal
quadrant demonstrates ill-defined increased heterogeneous
echotexture of the gallbladder fossa which correlates with the
infiltrating mass seen on preprocedural abdominal CT.  A small
amount of ascites remains following recent ultrasound guided
paracentesis.

The segment of the infiltrative mass within the medial aspect of
the left lobe of the liver adjacent to the gallbladder fossa was
selected for biopsy.  The procedure was planned.  The right upper
abdominal quadrant was prepped and draped in the usual sterile
fashion.  The overlying soft tissues were anesthetized with 1%
lidocaine with epinephrine.  A 17 gauge, 6.8 cm co-axial needle was
advanced into a peripheral aspect of the infiltrative mass and 2
fine needle aspirates were obtained with Potrillo Koh needle.  Quick
stain pathologic review demonstrated scattered atypical cells.
This was followed by the acquisition of 3 core biopsies with an 18
gauge core device under direct ultrasound guidance.

As there was a small amount of back bleeding from the coaxial
needle, and given the presence of a minimal amount of ascites, the
tract was embolized with a small amount of Gelfoam.  The co-axial
needle was removed and hemostasis was obtained with manual
compression.  Post procedural scanning was negative for definitive
area of hemorrhage or additional complication.  A dressing was
placed.  The patient tolerated the procedure well without immediate
post procedural complication.
IMPRESSION: Technically successful ultrasound guided fine needle aspiration and
core needle biopsy of the infiltrative mass about the gallbladder
fossa involving the medial segment of the left lobe of the liver

## 2014-01-23 NOTE — Telephone Encounter (Signed)
Close Encounter 

## 2015-03-04 ENCOUNTER — Emergency Department (HOSPITAL_COMMUNITY)
Admission: EM | Admit: 2015-03-04 | Discharge: 2015-03-04 | Discharge status: Absent without leave | Payer: Medicare Other

## 2015-03-04 ENCOUNTER — Encounter (HOSPITAL_COMMUNITY): Payer: Self-pay | Admitting: *Deleted

## 2015-03-04 NOTE — ED Notes (Deleted)
Pt reports sinus congestion for extended amount of time, has been to pcp and given medications but no relief x past week. Reports nasal congestion and feels mucus running down the back of his throat. Airway intact, no distress noted at triage.
